# Patient Record
Sex: Female | Born: 1960 | Race: White | Hispanic: No | Marital: Married | Smoking: Current every day smoker
Health system: Southern US, Community
[De-identification: ages and names within clinical notes are randomized; demographics above are authoritative.]

## PROBLEM LIST (undated history)

## (undated) DIAGNOSIS — E0590 Thyrotoxicosis, unspecified without thyrotoxic crisis or storm: Secondary | ICD-10-CM

## (undated) DIAGNOSIS — E119 Type 2 diabetes mellitus without complications: Secondary | ICD-10-CM

## (undated) HISTORY — DX: Type 2 diabetes mellitus without complications: E11.9

## (undated) HISTORY — DX: Thyrotoxicosis, unspecified without thyrotoxic crisis or storm: E05.90

---

## 2012-03-26 ENCOUNTER — Other Ambulatory Visit (HOSPITAL_COMMUNITY): Payer: Self-pay | Admitting: Family Medicine

## 2012-03-26 DIAGNOSIS — Z139 Encounter for screening, unspecified: Secondary | ICD-10-CM

## 2012-03-27 ENCOUNTER — Ambulatory Visit (HOSPITAL_COMMUNITY)
Admission: RE | Admit: 2012-03-27 | Discharge: 2012-03-27 | Disposition: A | Discharge status: Home / Self Care | Payer: Self-pay | Source: Ambulatory Visit | Attending: Family Medicine | Admitting: Family Medicine

## 2012-03-27 DIAGNOSIS — Z139 Encounter for screening, unspecified: Secondary | ICD-10-CM

## 2012-03-27 DIAGNOSIS — Z1231 Encounter for screening mammogram for malignant neoplasm of breast: Secondary | ICD-10-CM | POA: Insufficient documentation

## 2012-04-02 ENCOUNTER — Other Ambulatory Visit: Payer: Self-pay | Admitting: Family Medicine

## 2012-04-02 DIAGNOSIS — R928 Other abnormal and inconclusive findings on diagnostic imaging of breast: Secondary | ICD-10-CM

## 2012-04-18 ENCOUNTER — Other Ambulatory Visit (HOSPITAL_COMMUNITY): Payer: Self-pay | Admitting: Family Medicine

## 2012-04-18 ENCOUNTER — Ambulatory Visit (HOSPITAL_COMMUNITY)
Admission: RE | Admit: 2012-04-18 | Discharge: 2012-04-18 | Disposition: A | Discharge status: Home / Self Care | Payer: Self-pay | Source: Ambulatory Visit | Attending: Family Medicine | Admitting: Family Medicine

## 2012-04-18 DIAGNOSIS — R928 Other abnormal and inconclusive findings on diagnostic imaging of breast: Secondary | ICD-10-CM | POA: Insufficient documentation

## 2014-09-23 ENCOUNTER — Emergency Department (HOSPITAL_COMMUNITY)
Admission: EM | Admit: 2014-09-23 | Discharge: 2014-09-24 | Disposition: A | Discharge status: Home / Self Care | Payer: Self-pay | Attending: Emergency Medicine | Admitting: Emergency Medicine

## 2014-09-23 ENCOUNTER — Encounter (HOSPITAL_COMMUNITY): Payer: Self-pay | Admitting: *Deleted

## 2014-09-23 ENCOUNTER — Emergency Department (HOSPITAL_COMMUNITY): Payer: Self-pay

## 2014-09-23 DIAGNOSIS — Z720 Tobacco use: Secondary | ICD-10-CM | POA: Insufficient documentation

## 2014-09-23 DIAGNOSIS — R079 Chest pain, unspecified: Secondary | ICD-10-CM | POA: Insufficient documentation

## 2014-09-23 LAB — CBC WITH DIFFERENTIAL/PLATELET
Basophils Absolute: 0 10*3/uL (ref 0.0–0.1)
Basophils Relative: 0 % (ref 0–1)
EOS ABS: 0.2 10*3/uL (ref 0.0–0.7)
Eosinophils Relative: 1 % (ref 0–5)
HCT: 41.3 % (ref 36.0–46.0)
Hemoglobin: 13.8 g/dL (ref 12.0–15.0)
LYMPHS ABS: 2.7 10*3/uL (ref 0.7–4.0)
LYMPHS PCT: 18 % (ref 12–46)
MCH: 29.8 pg (ref 26.0–34.0)
MCHC: 33.4 g/dL (ref 30.0–36.0)
MCV: 89.2 fL (ref 78.0–100.0)
MONO ABS: 1.1 10*3/uL — AB (ref 0.1–1.0)
Monocytes Relative: 7 % (ref 3–12)
Neutro Abs: 11 10*3/uL — ABNORMAL HIGH (ref 1.7–7.7)
Neutrophils Relative %: 74 % (ref 43–77)
PLATELETS: 255 10*3/uL (ref 150–400)
RBC: 4.63 MIL/uL (ref 3.87–5.11)
RDW: 13.1 % (ref 11.5–15.5)
WBC: 14.9 10*3/uL — AB (ref 4.0–10.5)

## 2014-09-23 LAB — I-STAT TROPONIN, ED: Troponin i, poc: 0.01 ng/mL (ref 0.00–0.08)

## 2014-09-23 LAB — TROPONIN I: Troponin I: <0.3 ng/mL (ref <0.30)

## 2014-09-23 LAB — BASIC METABOLIC PANEL
Anion gap: 16 — ABNORMAL HIGH (ref 5–15)
BUN: 10 mg/dL (ref 6–23)
CALCIUM: 9.5 mg/dL (ref 8.4–10.5)
CO2: 22 meq/L (ref 19–32)
Chloride: 101 mEq/L (ref 96–112)
Creatinine, Ser: 0.79 mg/dL (ref 0.50–1.10)
GFR calc Af Amer: >90 mL/min (ref >90)
GLUCOSE: 168 mg/dL — AB (ref 70–99)
Potassium: 3.7 mEq/L (ref 3.7–5.3)
Sodium: 139 mEq/L (ref 137–147)

## 2014-09-23 MED ORDER — ASPIRIN 81 MG PO CHEW
324.0000 mg | CHEWABLE_TABLET | Freq: Once | ORAL | Status: AC
  Administered 2014-09-23: 324 mg via ORAL
  Filled 2014-09-23: qty 4

## 2014-09-23 NOTE — ED Notes (Signed)
MD at bedside. 

## 2014-09-23 NOTE — ED Notes (Signed)
Pt elected to wait for a repeat troponin. EDP made aware.

## 2014-09-23 NOTE — Discharge Instructions (Signed)

## 2014-09-23 NOTE — ED Provider Notes (Signed)
CSN: [id number]     Arrival date & time 09/23/14  1949 History   First MD Initiated Contact with Patient 09/23/14 2032     Chief Complaint  Patient presents with  . Chest Pain     Patient is a 53 y.o. female presenting with chest pain. The history is provided by the patient.  Chest Pain Pain location:  L chest Pain quality: dull   Pain radiates to:  Does not radiate Pain severity:  Moderate Onset quality:  Gradual Duration:  3 days Timing:  Constant Progression:  Improving Chronicity:  New Relieved by:  None tried Worsened by:  Nothing tried Associated symptoms: no abdominal pain, no diaphoresis, no dizziness, no shortness of breath, no syncope, not vomiting and no weakness   Risk factors: smoking   Risk factors: no coronary artery disease, no diabetes mellitus, no high cholesterol, no hypertension and no prior DVT/PE    Patient reports 3 days ago she had onset of left sided sharp pain that after 2 minutes became dull.  It has been present since but is now improving.  It does not radiate.  No sob/weakness/diaphoresis/nausea/vomiting.  No exertional CP.   She has no other complaints at this time She has never had this before   PMH - None Soc hx - smoker Family history - positive for CAD  History  Substance Use Topics  . Smoking status: Current Every Day Smoker  . Smokeless tobacco: Not on file  . Alcohol Use: Yes   OB History    No data available     Review of Systems  Constitutional: Negative for diaphoresis.  Respiratory: Negative for shortness of breath.   Cardiovascular: Positive for chest pain. Negative for syncope.  Gastrointestinal: Negative for vomiting and abdominal pain.  Neurological: Negative for dizziness, syncope and weakness.  All other systems reviewed and are negative.     Allergies  Review of patient's allergies indicates no known allergies.  Home Medications   Prior to Admission medications   Not on File   BP 150/93 mmHg  Pulse 90   Temp(Src) 99 F (37.2 C) (Oral)  Resp 18  Ht 5\' 3"  (1.6 m)  Wt 129 lb (58.514 kg)  BMI 22.86 kg/m2  SpO2 99%  LMP  (LMP Unknown) Physical Exam CONSTITUTIONAL: Well developed/well nourished HEAD: Normocephalic/atraumatic EYES: EOMI/PERRL ENMT: Mucous membranes moist NECK: supple no meningeal signs SPINE/BACK:entire spine nontender CV: S1/S2 noted, no murmurs/rubs/gallops noted Chest - nontender to palpation LUNGS: Lungs are clear to auscultation bilaterally, no apparent distress ABDOMEN: soft, nontender, no rebound or guarding, bowel sounds noted throughout abdomen GU:no cva tenderness NEURO: Pt is awake/alert/appropriate, moves all extremitiesx4.  No facial droop.   EXTREMITIES: pulses normal/equal, full ROM SKIN: warm, color normal PSYCH: no abnormalities of mood noted, alert and oriented to situation  ED Course  Procedures   9:51 PM Pt well appearing Story is very atypical for ACS Initial HEART score at 33 (age, family history, smoking) 10:37 PM Plan is to check repeat troponin at 12:21am Pt is feeling improved Signed out to Dr Juleen ChinaKohut to f/u on troponin   Medications  aspirin chewable tablet 324 mg (324 mg Oral Given 09/23/14 2131)    Labs Review Labs Reviewed  BASIC METABOLIC PANEL - Abnormal; Notable for the following:    Glucose, Bld 168 (*)    Anion gap 16 (*)    All other components within normal limits  CBC WITH DIFFERENTIAL - Abnormal; Notable for the following:    WBC  14.9 (*)    Neutro Abs 11.0 (*)    Monocytes Absolute 1.1 (*)    All other components within normal limits  I-STAT TROPOININ, ED    Imaging Review Dg Chest Portable 1 View  09/23/2014   CLINICAL DATA:  Chest pain  EXAM: PORTABLE CHEST - 1 VIEW  COMPARISON:  None.  FINDINGS: No cardiomegaly. Negative aortic and hilar contour. Generous lung volumes with diffuse interstitial coarsening. No cephalized blood flow or Kerley lines. No effusion or pneumothorax. No asymmetric lung opacity.   IMPRESSION: Bronchitic changes.  No pneumonia or edema.   Electronically Signed   By: Tiburcio PeaJonathan  Watts M.D.   On: 09/23/2014 22:06     EKG Interpretation   Date/Time:  Tuesday September 23 2014 20:46:14 EST Ventricular Rate:  88 PR Interval:  140 QRS Duration: 77 QT Interval:  371 QTC Calculation: 449 R Axis:   42 Text Interpretation:  Sinus rhythm Low voltage, precordial leads Baseline  wander in lead(s) V5 V6 No previous ECGs available Confirmed by Bebe ShaggyWICKLINE   MD, Dorinda HillNALD ([id number]) on 09/23/2014 9:06:40 PM     Medications  aspirin chewable tablet 324 mg (324 mg Oral Given 09/23/14 2131)    MDM   Final diagnoses:  Chest pain    Nursing notes including past medical history and social history reviewed and considered in documentation xrays/imaging reviewed by myself and considered during evaluation Labs/vital reviewed myself and considered during evaluation     Joya Gaskinsonald W Naoko Diperna, MD 09/23/14 2238

## 2014-09-23 NOTE — ED Notes (Signed)
Dull lt chest pain, "sore",   No injury

## 2014-09-24 NOTE — ED Notes (Signed)
MD at bedside. 

## 2014-09-24 NOTE — ED Provider Notes (Signed)
Patient left the change of shift to get a second troponin. Patient reports she has had left upper lateral chest pain that started 3 days ago and was constant with a intermittent sharp pain that lasts 1-2 minutes. She denies shortness of breath, cough, or fever.  Patient is alert and cooperative in no distress.  Results for orders placed or performed during the hospital encounter of 09/23/14  Result Value Ref Range   Troponin I <0.30 <0.30 ng/mL  I-stat troponin, ED  Result Value Ref Range   Troponin i, poc 0.01 0.00 - 0.08 ng/mL   Comment 3           Dg Chest Portable 1 View  09/23/2014   CLINICAL DATA:  Chest pain  EXAM: PORTABLE CHEST - 1 VIEW  COMPARISON:  None.  FINDINGS: No cardiomegaly. Negative aortic and hilar contour. Generous lung volumes with diffuse interstitial coarsening. No cephalized blood flow or Kerley lines. No effusion or pneumothorax. No asymmetric lung opacity.  IMPRESSION: Bronchitic changes.  No pneumonia or edema.   Electronically Signed   By: Tiburcio PeaJonathan  Watts M.D.   On: 09/23/2014 22:06     EKG Interpretation  Date/Time:  Tuesday September 23 2014 20:46:14 EST Ventricular Rate:  88 PR Interval:  140 QRS Duration: 77 QT Interval:  371 QTC Calculation: 449 R Axis:   42 Text Interpretation:  Sinus rhythm Low voltage, precordial leads Baseline wander in lead(s) V5 V6 No previous ECGs available Confirmed by Bebe ShaggyWICKLINE  MD, Dorinda HillNALD ([id number]) on 09/23/2014 9:06:40 PM      Plan discharge with cardiology referral.    Devoria AlbeIva Zadin Lange, MD, Franz DellFACEP   Jesper Stirewalt L Pansie Guggisberg, MD 09/24/14 [phone]

## 2015-12-06 IMAGING — CR DG CHEST 1V PORT
1 series · 1 of 1 positions shown · non-contrast
Comparison: None.

CLINICAL DATA: Chest pain

EXAM:
PORTABLE CHEST - 1 VIEW

[portable]
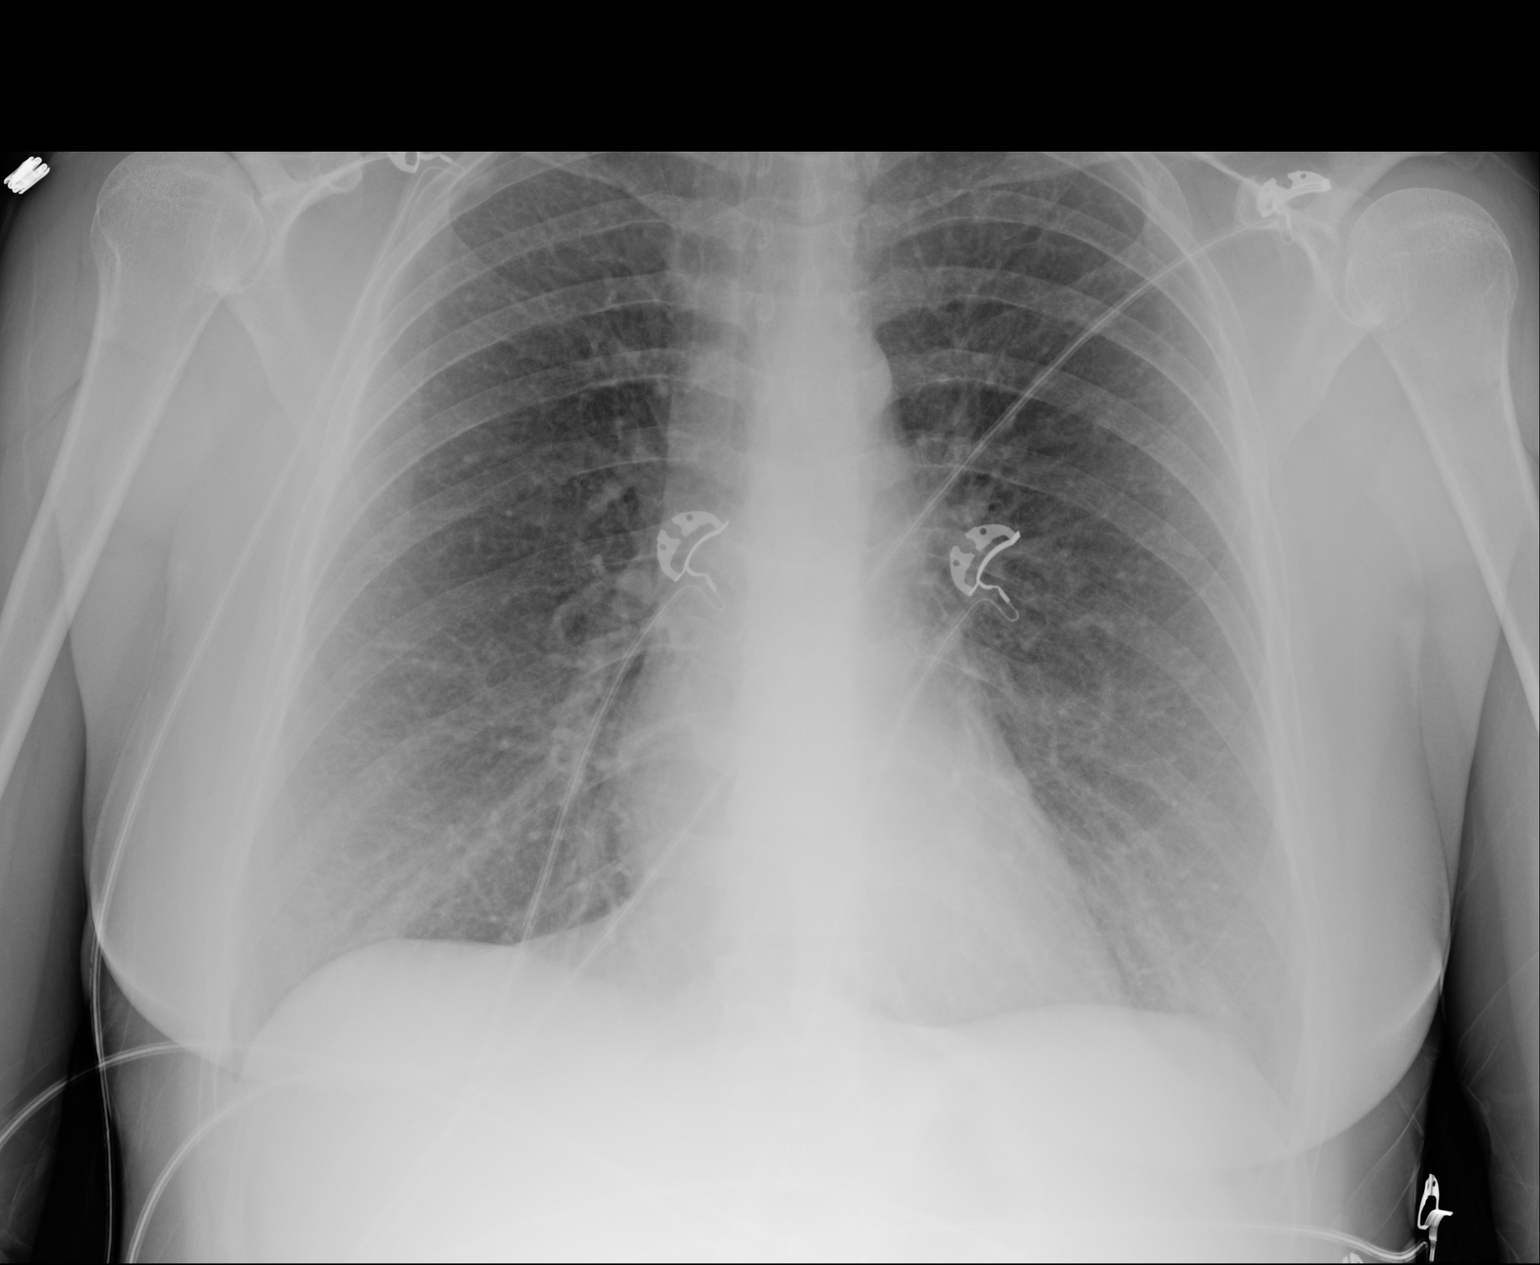

[1 of 1 positions shown; findings below may reference images not displayed]

FINDINGS: No cardiomegaly. Negative aortic and hilar contour. Generous lung
volumes with diffuse interstitial coarsening. No cephalized blood
flow or Kerley lines. No effusion or pneumothorax. No asymmetric
lung opacity.
IMPRESSION: Bronchitic changes.  No pneumonia or edema.

## 2017-09-26 ENCOUNTER — Ambulatory Visit (HOSPITAL_COMMUNITY)
Admission: RE | Admit: 2017-09-26 | Discharge: 2017-09-26 | Disposition: A | Discharge status: Home / Self Care | Payer: Medicaid Other | Source: Ambulatory Visit | Attending: Family Medicine | Admitting: Family Medicine

## 2017-09-26 ENCOUNTER — Other Ambulatory Visit (HOSPITAL_COMMUNITY): Payer: Self-pay | Admitting: Family Medicine

## 2017-09-26 DIAGNOSIS — M25522 Pain in left elbow: Secondary | ICD-10-CM | POA: Diagnosis present

## 2018-06-21 ENCOUNTER — Other Ambulatory Visit (HOSPITAL_COMMUNITY): Payer: Self-pay | Admitting: Family Medicine

## 2018-06-21 DIAGNOSIS — Z1231 Encounter for screening mammogram for malignant neoplasm of breast: Secondary | ICD-10-CM

## 2018-06-25 ENCOUNTER — Other Ambulatory Visit (HOSPITAL_COMMUNITY): Payer: Self-pay | Admitting: Family Medicine

## 2018-06-25 DIAGNOSIS — R921 Mammographic calcification found on diagnostic imaging of breast: Secondary | ICD-10-CM

## 2021-05-26 LAB — TSH: TSH: 0.01 — AB (ref 0.41–5.90)

## 2021-05-28 ENCOUNTER — Other Ambulatory Visit (HOSPITAL_COMMUNITY): Payer: Self-pay | Admitting: Family Medicine

## 2021-05-28 DIAGNOSIS — E0590 Thyrotoxicosis, unspecified without thyrotoxic crisis or storm: Secondary | ICD-10-CM

## 2021-06-08 ENCOUNTER — Ambulatory Visit (HOSPITAL_COMMUNITY)
Admission: RE | Admit: 2021-06-08 | Discharge: 2021-06-08 | Disposition: A | Discharge status: Home / Self Care | Payer: Medicaid Other | Source: Ambulatory Visit | Attending: Family Medicine | Admitting: Family Medicine

## 2021-06-08 ENCOUNTER — Other Ambulatory Visit: Payer: Self-pay

## 2021-06-08 DIAGNOSIS — E0590 Thyrotoxicosis, unspecified without thyrotoxic crisis or storm: Secondary | ICD-10-CM | POA: Diagnosis not present

## 2021-06-23 ENCOUNTER — Other Ambulatory Visit: Payer: Self-pay | Admitting: Otolaryngology

## 2021-06-23 ENCOUNTER — Other Ambulatory Visit (HOSPITAL_COMMUNITY): Payer: Self-pay | Admitting: Otolaryngology

## 2021-06-23 DIAGNOSIS — E041 Nontoxic single thyroid nodule: Secondary | ICD-10-CM

## 2021-06-24 ENCOUNTER — Ambulatory Visit: Payer: Medicaid Other | Admitting: Nurse Practitioner

## 2021-06-24 ENCOUNTER — Other Ambulatory Visit: Payer: Self-pay

## 2021-06-24 ENCOUNTER — Encounter: Payer: Self-pay | Admitting: Nurse Practitioner

## 2021-06-24 VITALS — BP 105/63 | HR 85 | Ht 62.25 in | Wt 119.6 lb

## 2021-06-24 DIAGNOSIS — E0590 Thyrotoxicosis, unspecified without thyrotoxic crisis or storm: Secondary | ICD-10-CM | POA: Diagnosis not present

## 2021-06-24 NOTE — Progress Notes (Signed)
06/24/2021     Endocrinology Consult Note    Subjective:    Patient ID: Monica Cisneros, female    DOB: 24-Jul-1961, PCP Zhou-Talbert, Ralene Bathe, MD.   Past Medical History:  Diagnosis Date   Diabetes mellitus, type II (HCC)    Hyperthyroidism     History reviewed. No pertinent surgical history.  Social History   Socioeconomic History   Marital status: Married    Spouse name: Not on file   Number of children: Not on file   Years of education: Not on file   Highest education level: Not on file  Occupational History   Not on file  Tobacco Use   Smoking status: Every Day    Packs/day: 1.00    Types: Cigarettes   Smokeless tobacco: Never  Vaping Use   Vaping Use: Never used  Substance and Sexual Activity   Alcohol use: Not Currently   Drug use: No   Sexual activity: Not on file  Other Topics Concern   Not on file  Social History Narrative   Not on file   Social Determinants of Health   Financial Resource Strain: Not on file  Food Insecurity: Not on file  Transportation Needs: Not on file  Physical Activity: Not on file  Stress: Not on file  Social Connections: Not on file    History reviewed. No pertinent family history.  Outpatient Encounter Medications as of 06/24/2021  Medication Sig   Ascorbic Acid (VITAMIN C) 1000 MG tablet 1 tablet   aspirin 81 MG chewable tablet Chew 81 mg by mouth daily.   atorvastatin (LIPITOR) 40 MG tablet Take 40 mg by mouth daily.   cholecalciferol (VITAMIN D3) 25 MCG (1000 UNIT) tablet 1 tablet   cyclobenzaprine (FLEXERIL) 10 MG tablet Take 10 mg by mouth 3 (three) times daily.   escitalopram (LEXAPRO) 10 MG tablet Take 10 mg by mouth daily.   hydrOXYzine (ATARAX/VISTARIL) 25 MG tablet 1 tablet as needed   insulin detemir (LEVEMIR FLEXTOUCH) 100 UNIT/ML FlexPen Inject 15 Units into the skin at bedtime.   lisinopril (ZESTRIL) 10 MG tablet Take 10 mg by mouth daily.   metFORMIN (GLUCOPHAGE) 500 MG tablet Take 500 mg by  mouth 2 (two) times daily.   naproxen (NAPROSYN) 500 MG tablet Take 500 mg by mouth 2 (two) times daily. For neck   Omega-3 Fatty Acids (FISH OIL) 1000 MG CAPS 1 capsule   No facility-administered encounter medications on file as of 06/24/2021.    ALLERGIES: No Known Allergies  VACCINATION STATUS:  There is no immunization history on file for this patient.   HPI  Monica Cisneros is 60 y.o. female who presents today with a medical history as above. she is being seen in consultation for hyperthyroidism requested by Zhou-Talbert, Ralene Bathe, MD.  she has been dealing with symptoms of irritability, unintentional weight loss, tremors, and insomnia for about 6 months. These symptoms are progressively worsening and troubling to her.  her most recent thyroid labs revealed suppressed TSH of < 0.01 and elevated Free T4 of 2.3 on 05/26/21.  she denies dysphagia, choking, shortness of breath, no recent voice change.    she does have extensive family history of thyroid dysfunction in her daughters, siblings, and parents, but denies family hx of thyroid cancer. she denies personal history of goiter. she is not on any anti-thyroid medications nor on any thyroid hormone supplements. Denies use of Biotin containing supplements.  she is willing to proceed with appropriate work  up and therapy for thyrotoxicosis.  She did have thyroid ultrasound on 06/08/21 showing moderately suspicious nodule in the left upper lobe which recommended biopsy which she has scheduled for next Thursday.  Review of systems  Constitutional: + steadily decreasing body weight, current Body mass index is 21.7 kg/m., no fatigue, no subjective hyperthermia, no subjective hypothermia Eyes: no blurry vision, no xerophthalmia ENT: no sore throat, no nodules palpated in throat, no dysphagia/odynophagia, no hoarseness Cardiovascular: no chest pain, no shortness of breath, no palpitations, no leg swelling Respiratory: no cough, no shortness of  breath Gastrointestinal: no nausea/vomiting/diarrhea Musculoskeletal: no muscle/joint aches Skin: no rashes, no hyperemia Neurological: + tremors, no numbness, no tingling, no dizziness Psychiatric: no depression, + anxiety, irritability, insomnia   Objective:    BP 105/63   Pulse 85   Ht 5' 2.25" (1.581 m)   Wt 119 lb 9.6 oz (54.3 kg)   LMP  (LMP Unknown)   BMI 21.70 kg/m   Wt Readings from Last 3 Encounters:  06/24/21 119 lb 9.6 oz (54.3 kg)  09/23/14 129 lb (58.5 kg)     BP Readings from Last 3 Encounters:  06/24/21 105/63  09/23/14 150/93                         Physical Exam- Limited  Constitutional:  Body mass index is 21.7 kg/m. , not in acute distress, normal state of mind Eyes:  EOMI, no exophthalmos Neck: Supple Thyroid: No gross goiter Cardiovascular: RRR, no murmurs, rubs, or gallops, no edema Respiratory: Adequate breathing efforts, no crackles, rales, rhonchi, or wheezing Musculoskeletal: no gross deformities, strength intact in all four extremities, no gross restriction of joint movements Skin:  no rashes, no hyperemia Neurological: + tremor with outstretched hands   CMP     Component Value Date/Time   NA 139 09/23/2014 2114   K 3.7 09/23/2014 2114   CL 101 09/23/2014 2114   CO2 22 09/23/2014 2114   GLUCOSE 168 (H) 09/23/2014 2114   BUN 10 09/23/2014 2114   CREATININE 0.79 09/23/2014 2114   CALCIUM 9.5 09/23/2014 2114   GFRNONAA >90 09/23/2014 2114   GFRAA >90 09/23/2014 2114     CBC    Component Value Date/Time   WBC 14.9 (H) 09/23/2014 2114   RBC 4.63 09/23/2014 2114   HGB 13.8 09/23/2014 2114   HCT 41.3 09/23/2014 2114   PLT 255 09/23/2014 2114   MCV 89.2 09/23/2014 2114   MCH 29.8 09/23/2014 2114   MCHC 33.4 09/23/2014 2114   RDW 13.1 09/23/2014 2114   LYMPHSABS 2.7 09/23/2014 2114   MONOABS 1.1 (H) 09/23/2014 2114   EOSABS 0.2 09/23/2014 2114   BASOSABS 0.0 09/23/2014 2114     Diabetic Labs (most recent): No results  found for: HGBA1C  Lipid Panel  No results found for: CHOL, TRIG, HDL, CHOLHDL, VLDL, LDLCALC, LDLDIRECT, LABVLDL   Lab Results  Component Value Date   TSH 0.01 (A) 05/26/2021     Thyroid US from 06/08/2021 CLINICAL DATA:  Hyperthyroid.   EXAM: THYROID ULTRASOUND   TECHNIQUE: Ultrasound examination of the thyroid gland and adjacent soft tissues was performed.   COMPARISON:  None.   FINDINGS: Parenchymal Echotexture: Moderately heterogenous   Isthmus: 0.2 cm   Right lobe: 4.6 x 2.0 x 1.4 cm   Left lobe: 4.2 x 1.7 x 1.4 cm   _________________________________________________________   Estimated total number of nodules >/= 1 cm: 1   Number of spongiform  nodules >/=  2 cm not described below (TR1): 0   Number of mixed cystic and solid nodules >/= 1.5 cm not described below (TR2): 0   _________________________________________________________   Nodule # 2:   Location: Left; Superior   Maximum size: 1.7 cm; Other 2 dimensions: 1.4 x 1.1 cm   Composition: solid/almost completely solid (2)   Echogenicity: hypoechoic (2)   Shape: not taller-than-wide (0)   Margins: smooth (0)   Echogenic foci: none (0)   ACR TI-RADS total points: 4.   ACR TI-RADS risk category: TR4 (4-6 points).   ACR TI-RADS recommendations:   **Given size (>/= 1.5 cm) and appearance, fine needle aspiration of this moderately suspicious nodule should be considered based on TI-RADS criteria.   _________________________________________________________   Additional small subcentimeter thyroid nodules bilaterally which do not meet criteria to warrant further evaluation.   IMPRESSION: Approximately 1.7 cm TI-RADS category 4 nodule in the left superior gland meets criteria to consider fine-needle aspiration biopsy.   The background thyroid gland is moderately heterogeneous in appearance.   The above is in keeping with the ACR TI-RADS recommendations - J Am Coll Radiol  2017;14:587-595.     Electronically Signed   By: Malachy Moan M.D.   On: 06/08/2021 11:46   Assessment & Plan:   1. Hyperthyroidism- supect r/t Graves disease given strong family history and presentation  she is being seen at a kind request of Zhou-Talbert, Ralene Bathe, MD.  her history and most recent labs are reviewed, and she was examined clinically. Subjective and objective findings are consistent with thyrotoxicosis likely from primary hyperthyroidism. The potential risks of untreated thyrotoxicosis and the need for definitive therapy have been discussed in detail with her, and she agrees to proceed with diagnostic workup and treatment plan.   Confirmatory thyroid uptake and scan will be scheduled to be done as soon as possible.  I recommended holding off on the nodule biopsy for now as the hyperthyroidism is more important at this time and because the likelihood of malignancy and hyperthyroidism is extremely rare.   Options of therapy are discussed with her.  We discussed the option of treating it with medications including methimazole or PTU which may have side effects including rash, transaminitis, and bone marrow suppression.  We also discussed the option of definitive therapy with RAI ablation of the thyroid. If she is found to have primary hyperthyroidism from Graves' disease , toxic multinodular goiter or toxic nodular goiter the preferred modality of treatment would be I-131 thyroid ablation. Surgery is another choice of treatment in some cases, in her case surgery is not a good fit for presentation without goiter.  -Patient is made aware of the high likelihood of post ablative hypothyroidism with subsequent need for lifelong thyroid hormone replacement. sheunderstands this outcome and she is  willing to proceed.      she will return in 3 weeks to discuss test results and to discuss treatment options further.   I did not initiate any new prescriptions today.  Her HR was  controlled.     -Patient is advised to maintain close follow up with Zhou-Talbert, Ralene Bathe, MD for primary care needs.   - Time spent with the patient: 60 minutes, of which >50% was spent in obtaining information about her symptoms, reviewing her previous labs, evaluations, and treatments, counseling her about her hyperthyroidism , and developing a plan to confirm the diagnosis and long term treatment as necessary. Please refer to "Patient Self Inventory" in the  Media tab for reviewed elements of pertinent patient history.  Jannae Traxler participated in the discussions, expressed understanding, and voiced agreement with the above plans.  All questions were answered to her satisfaction. she is encouraged to contact clinic should she have any questions or concerns prior to her return visit.   Follow up plan: Return in about 3 weeks (around 07/15/2021) for Thyroid follow up; uptake and scan.   Thank you for involving me in the care of this pleasant patient, and I will continue to update you with her progress.    Ronny Bacon, [facility] [address] [place], [place] [address] Phone: [phone] Fax: [phone]  06/24/2021, 4:10 PM

## 2021-06-24 NOTE — Patient Instructions (Signed)
Hyperthyroidism  Hyperthyroidism is when the thyroid gland is too active (overactive). The thyroid gland is a small gland located in the lower front part of the neck, just in front of the windpipe (trachea). This gland makes hormones that help control how the body uses food for energy (metabolism) as well as how the heart and brain function. These hormones also play a role in keeping your bones strong. When the thyroid is overactive, it produces toomuch of a hormone called thyroxine. What are the causes? This condition may be caused by: Graves' disease. This is a disorder in which the body's disease-fighting system (immune system) attacks the thyroid gland. This is the most common cause. Inflammation of the thyroid gland. A tumor in the thyroid gland. Use of certain medicines, including: Prescription thyroid hormone replacement. Herbal supplements that mimic thyroid hormones. Amiodarone therapy. Solid or fluid-filled lumps within your thyroid gland (thyroid nodules). Taking in a large amount of iodine from foods or medicines. What increases the risk? You are more likely to develop this condition if: You are female. You have a family history of thyroid conditions. You smoke tobacco. You use a medicine called lithium. You take medicines that affect the immune system (immunosuppressants). What are the signs or symptoms? Symptoms of this condition include: Nervousness. Inability to tolerate heat. Unexplained weight loss. Diarrhea. Change in the texture of hair or skin. Heart skipping beats or making extra beats. Rapid heart rate. Loss of menstruation. Shaky hands. Fatigue. Restlessness. Sleep problems. Enlarged thyroid gland or a lump in the thyroid (nodule). You may also have symptoms of Graves' disease, which may include: Protruding eyes. Dry eyes. Red or swollen eyes. Problems with vision. How is this diagnosed? This condition may be diagnosed based on: Your symptoms and  medical history. A physical exam. Blood tests. Thyroid ultrasound. This test involves using sound waves to produce images of the thyroid gland. A thyroid scan. A radioactive substance is injected into a vein, and images show how much iodine is present in the thyroid. Radioactive iodine uptake test (RAIU). A small amount of radioactive iodine is given by mouth to see how much iodine the thyroid absorbs after a certain amount of time. How is this treated? Treatment depends on the cause and severity of the condition. Treatment may include: Medicines to reduce the amount of thyroid hormone your body makes. Radioactive iodine treatment (radioiodine therapy). This involves swallowing a small dose of radioactive iodine, in capsule or liquid form, to kill thyroid cells. Surgery to remove part or all of your thyroid gland. You may need to take thyroid hormone replacement medicine for the rest of your life after thyroid surgery. Medicines to help manage your symptoms. Follow these instructions at home:  Take over-the-counter and prescription medicines only as told by your health care provider. Do not use any products that contain nicotine or tobacco, such as cigarettes and e-cigarettes. If you need help quitting, ask your health care provider. Follow any instructions from your health care provider about diet. You may be instructed to limit foods that contain iodine. Keep all follow-up visits as told by your health care provider. This is important. You will need to have blood tests regularly so that your health care provider can monitor your condition. Contact a health care provider if: Your symptoms do not get better with treatment. You have a fever. You are taking thyroid hormone replacement medicine and you: Have symptoms of depression. Feel like you are tired all the time. Gain weight. Get help right   away if: You have chest pain. You have decreased alertness or a change in your awareness. You  have abdominal pain. You feel dizzy. You have a rapid heartbeat. You have an irregular heartbeat. You have difficulty breathing. Summary The thyroid gland is a small gland located in the lower front part of the neck, just in front of the windpipe (trachea). Hyperthyroidism is when the thyroid gland is too active (overactive) and produces too much of a hormone called thyroxine. The most common cause is Graves' disease, a disorder in which your immune system attacks the thyroid gland. Hyperthyroidism can cause various symptoms, such as unexplained weight loss, nervousness, inability to tolerate heat, or changes in your heartbeat. Treatment may include medicine to reduce the amount of thyroid hormone your body makes, radioiodine therapy, surgery, or medicines to manage symptoms. This information is not intended to replace advice given to you by your health care provider. Make sure you discuss any questions you have with your healthcare provider. Document Revised: 06/11/2020 Document Reviewed: 06/11/2020 Elsevier Patient Education  2022 [employer]  

## 2021-07-01 ENCOUNTER — Ambulatory Visit (HOSPITAL_COMMUNITY): Payer: Medicaid Other

## 2021-07-01 ENCOUNTER — Encounter (HOSPITAL_COMMUNITY): Payer: Self-pay

## 2021-07-08 ENCOUNTER — Telehealth: Payer: Self-pay

## 2021-07-08 NOTE — Telephone Encounter (Signed)
Pt left a VM to return a call

## 2021-07-08 NOTE — Telephone Encounter (Signed)
Left a message requesting a return call to the office. 

## 2021-07-08 NOTE — Telephone Encounter (Signed)
Patient has not heard from anyone to sch her uptake and scan and she is to see [place] next Thursday with the results. Please Advise

## 2021-07-08 NOTE — Telephone Encounter (Signed)
Pt scheduled for thyroid uptake & scan on Oct 4th at 10:00 and 1:30 and the 5th at 10:00. Pt made aware.

## 2021-07-13 ENCOUNTER — Encounter (HOSPITAL_COMMUNITY)
Admission: RE | Admit: 2021-07-13 | Discharge: 2021-07-13 | Disposition: A | Discharge status: Home / Self Care | Payer: Medicaid Other | Source: Ambulatory Visit | Attending: Nurse Practitioner | Admitting: Nurse Practitioner

## 2021-07-13 ENCOUNTER — Other Ambulatory Visit: Payer: Self-pay

## 2021-07-13 DIAGNOSIS — R634 Abnormal weight loss: Secondary | ICD-10-CM | POA: Insufficient documentation

## 2021-07-13 DIAGNOSIS — Z8349 Family history of other endocrine, nutritional and metabolic diseases: Secondary | ICD-10-CM | POA: Insufficient documentation

## 2021-07-13 DIAGNOSIS — E0590 Thyrotoxicosis, unspecified without thyrotoxic crisis or storm: Secondary | ICD-10-CM | POA: Diagnosis not present

## 2021-07-13 MED ORDER — SODIUM IODIDE I-123 7.4 MBQ CAPS
434.0000 | ORAL_CAPSULE | Freq: Once | ORAL | Status: AC
  Administered 2021-07-13: 434 via ORAL

## 2021-07-14 ENCOUNTER — Encounter (HOSPITAL_COMMUNITY)
Admission: RE | Admit: 2021-07-14 | Discharge: 2021-07-14 | Disposition: A | Discharge status: Home / Self Care | Payer: Medicaid Other | Source: Ambulatory Visit | Attending: Nurse Practitioner | Admitting: Nurse Practitioner

## 2021-07-15 ENCOUNTER — Ambulatory Visit: Payer: Medicaid Other | Admitting: Nurse Practitioner

## 2021-07-19 ENCOUNTER — Ambulatory Visit: Payer: Medicaid Other | Admitting: Nurse Practitioner

## 2021-07-19 ENCOUNTER — Other Ambulatory Visit: Payer: Self-pay

## 2021-07-19 ENCOUNTER — Encounter: Payer: Self-pay | Admitting: Nurse Practitioner

## 2021-07-19 VITALS — BP 163/77 | HR 86 | Ht 62.25 in | Wt 125.4 lb

## 2021-07-19 DIAGNOSIS — E0500 Thyrotoxicosis with diffuse goiter without thyrotoxic crisis or storm: Secondary | ICD-10-CM | POA: Diagnosis not present

## 2021-07-19 DIAGNOSIS — E0590 Thyrotoxicosis, unspecified without thyrotoxic crisis or storm: Secondary | ICD-10-CM

## 2021-07-19 NOTE — Patient Instructions (Signed)
Radioiodine (I-131) Therapy for Hyperthyroidism, Care After This sheet gives you information about how to care for yourself after your procedure. Your health care provider may also give you more specific instructions. If you have problems or questions, contact your health careprovider. What can I expect after the procedure? After the procedure, it is common to have a sore throat or mild neck pain forseveral months. Follow these instructions at home: For the first 48 hours after the procedure:  Do not use public bathrooms. Flush twice after using the toilet. Use tissue to wipe up any urine on the toilet seat. Men should sit down to urinate to reduce the risk of splashing. Take a bath or shower every day. Rinse the sink and tub after each use. Do not make food for other people. Wash your sheets, towels, and clothes each day. Wash them separately from other people's items. Drink enough fluid to keep your urine pale yellow.  Pregnancy and breastfeeding Do not try to become pregnant for as long as you are told by your health care provider. This may be for up to 1 year after your procedure. Use a method of birth control (contraception) to prevent pregnancy. Talk to your health care provider about what form of contraception is right for you. Do not breastfeed for as long as you are told by your health care provider, if this applies. General instructions  Avoid close contact with other people. Avoiding contact with children and pregnant women is especially important. Do this for 1 week after your procedure. Try to keep a distance of about 6 feet (1.8 m) from others. Sleep alone. Do not have close intimate contact of any kind. This includes kissing, physical contact, and sexual intercourse. If you have children, arrange for child care. Do not ride in vehicles with other people. Do not stay in a hotel. Stay home from work or school as told by your health care provider. Take over-the-counter and  prescription medicines only as told by your health care provider. This includes any thyroid medicines. When your health care provider tells you it is safe to travel, carry a note from the health care provider to explain that you have had radioiodine therapy. This is needed because radioactivity may set off detectors in airports or other places. Do not share utensils--such as silverware, plates, or cups--for as long as told by your health care provider. Use disposable utensils, or clean your utensils separately from those of others. Wash your hands often. If soap and water are not available, use hand sanitizer. Keep all follow-up visits as told by your health care provider. This is important. Follow-up visits may be required every 1-2 months after treatment.  Contact a health care provider if you: Have pain that gets worse or does not get better with medicine. Have a dry mouth. Lose your sense of taste. Become pregnant within 1 year of your procedure, if this applies. Feel unusually tired (fatigued). Have very dry skin. Start to lose your hair. Have bowel movements that are less frequent or more difficult than usual (constipation). Have unexplained weight gain. Always feel cold. Summary After the procedure, it is common to have a sore throat or mild neck pain for several months. Do not try to become pregnant for as long as you are told by your health care provider. Avoid close contact with other people. Avoiding contact with children and pregnant women is especially important. Do this for 1 week after your procedure. Keep all follow-up visits as told by   your health care provider. This is important. This information is not intended to replace advice given to you by your health care provider. Make sure you discuss any questions you have with your healthcare provider. Document Revised: 11/08/2018 Document Reviewed: 11/08/2018 Elsevier Patient Education  2022 Elsevier Inc.  

## 2021-07-19 NOTE — Progress Notes (Signed)
07/19/2021     Endocrinology Follow Up Note    Subjective:    Patient ID: Monica Cisneros, female    DOB: November 14, 1960, PCP Zhou-Talbert, Ralene Bathe, MD.   Past Medical History:  Diagnosis Date   Diabetes mellitus, type II (HCC)    Hyperthyroidism     History reviewed. No pertinent surgical history.  Social History   Socioeconomic History   Marital status: Married    Spouse name: Not on file   Number of children: Not on file   Years of education: Not on file   Highest education level: Not on file  Occupational History   Not on file  Tobacco Use   Smoking status: Every Day    Packs/day: 1.00    Types: Cigarettes   Smokeless tobacco: Never  Vaping Use   Vaping Use: Never used  Substance and Sexual Activity   Alcohol use: Not Currently   Drug use: No   Sexual activity: Not on file  Other Topics Concern   Not on file  Social History Narrative   Not on file   Social Determinants of Health   Financial Resource Strain: Not on file  Food Insecurity: Not on file  Transportation Needs: Not on file  Physical Activity: Not on file  Stress: Not on file  Social Connections: Not on file    History reviewed. No pertinent family history.  Outpatient Encounter Medications as of 07/19/2021  Medication Sig   Ascorbic Acid (VITAMIN C) 1000 MG tablet 1 tablet   aspirin 81 MG chewable tablet Chew 81 mg by mouth daily.   atorvastatin (LIPITOR) 40 MG tablet Take 40 mg by mouth daily.   cholecalciferol (VITAMIN D3) 25 MCG (1000 UNIT) tablet 1 tablet   cyclobenzaprine (FLEXERIL) 10 MG tablet Take 10 mg by mouth 3 (three) times daily.   escitalopram (LEXAPRO) 10 MG tablet Take 10 mg by mouth daily.   hydrOXYzine (ATARAX/VISTARIL) 25 MG tablet 1 tablet as needed   insulin detemir (LEVEMIR FLEXTOUCH) 100 UNIT/ML FlexPen Inject 15 Units into the skin at bedtime.   lisinopril (ZESTRIL) 10 MG tablet Take 10 mg by mouth daily.   metFORMIN (GLUCOPHAGE) 500 MG tablet Take 500 mg  by mouth 2 (two) times daily.   naproxen (NAPROSYN) 500 MG tablet Take 500 mg by mouth 2 (two) times daily. For neck   Omega-3 Fatty Acids (FISH OIL) 1000 MG CAPS 1 capsule   No facility-administered encounter medications on file as of 07/19/2021.    ALLERGIES: No Known Allergies  VACCINATION STATUS:  There is no immunization history on file for this patient.   HPI  Monica Cisneros is 60 y.o. female who presents today with a medical history as above. she is being seen in follow up after being seen in consultation for hyperthyroidism requested by Zhou-Talbert, Ralene Bathe, MD.  she has been dealing with symptoms of irritability, unintentional weight loss, tremors, and insomnia for about 6 months. These symptoms are progressively worsening and troubling to her.  her most recent thyroid labs revealed suppressed TSH of < 0.01 and elevated Free T4 of 2.3 on 05/26/21.  she denies dysphagia, choking, shortness of breath, no recent voice change.    she does have extensive family history of thyroid dysfunction in her daughters, siblings, and parents, but denies family hx of thyroid cancer. she denies personal history of goiter. she is not on any anti-thyroid medications nor on any thyroid hormone supplements. Denies use of Biotin containing supplements.  she  is willing to proceed with appropriate work up and therapy for thyrotoxicosis.  She did have thyroid ultrasound on 06/08/21 showing moderately suspicious nodule in the left upper lobe which recommended biopsy which she has scheduled for next Thursday.  Review of systems  Constitutional: + steadily decreasing body weight, current Body mass index is 22.75 kg/m., no fatigue, no subjective hyperthermia, no subjective hypothermia Eyes: no blurry vision, no xerophthalmia ENT: no sore throat, no nodules palpated in throat, no dysphagia/odynophagia, no hoarseness Cardiovascular: no chest pain, no shortness of breath, no palpitations, no leg  swelling Respiratory: no cough, no shortness of breath Gastrointestinal: no nausea/vomiting/diarrhea Musculoskeletal: no muscle/joint aches Skin: no rashes, no hyperemia Neurological: + tremors, no numbness, no tingling, no dizziness Psychiatric: no depression, + anxiety, irritability, insomnia   Objective:    BP (!) 163/77   Pulse 86   Ht 5' 2.25" (1.581 m)   Wt 125 lb 6.4 oz (56.9 kg)   LMP  (LMP Unknown)   BMI 22.75 kg/m   Wt Readings from Last 3 Encounters:  07/19/21 125 lb 6.4 oz (56.9 kg)  06/24/21 119 lb 9.6 oz (54.3 kg)  09/23/14 129 lb (58.5 kg)     BP Readings from Last 3 Encounters:  07/19/21 (!) 163/77  06/24/21 105/63  09/23/14 150/93                         Physical Exam- Limited  Constitutional:  Body mass index is 22.75 kg/m. , not in acute distress, normal state of mind Eyes:  EOMI, no exophthalmos Neck: Supple Thyroid: No gross goiter Cardiovascular: RRR, no murmurs, rubs, or gallops, no edema Respiratory: Adequate breathing efforts, no crackles, rales, rhonchi, or wheezing Musculoskeletal: no gross deformities, strength intact in all four extremities, no gross restriction of joint movements Skin:  no rashes, no hyperemia Neurological: + tremor with outstretched hands   CMP     Component Value Date/Time   NA 139 09/23/2014 2114   K 3.7 09/23/2014 2114   CL 101 09/23/2014 2114   CO2 22 09/23/2014 2114   GLUCOSE 168 (H) 09/23/2014 2114   BUN 10 09/23/2014 2114   CREATININE 0.79 09/23/2014 2114   CALCIUM 9.5 09/23/2014 2114   GFRNONAA >90 09/23/2014 2114   GFRAA >90 09/23/2014 2114     CBC    Component Value Date/Time   WBC 14.9 (H) 09/23/2014 2114   RBC 4.63 09/23/2014 2114   HGB 13.8 09/23/2014 2114   HCT 41.3 09/23/2014 2114   PLT 255 09/23/2014 2114   MCV 89.2 09/23/2014 2114   MCH 29.8 09/23/2014 2114   MCHC 33.4 09/23/2014 2114   RDW 13.1 09/23/2014 2114   LYMPHSABS 2.7 09/23/2014 2114   MONOABS 1.1 (H) 09/23/2014 2114    EOSABS 0.2 09/23/2014 2114   BASOSABS 0.0 09/23/2014 2114     Diabetic Labs (most recent): No results found for: HGBA1C  Lipid Panel  No results found for: CHOL, TRIG, HDL, CHOLHDL, VLDL, LDLCALC, LDLDIRECT, LABVLDL   Lab Results  Component Value Date   TSH 0.01 (A) 05/26/2021     Thyroid US from 06/08/2021 CLINICAL DATA:  Hyperthyroid.   EXAM: THYROID ULTRASOUND   TECHNIQUE: Ultrasound examination of the thyroid gland and adjacent soft tissues was performed.   COMPARISON:  None.   FINDINGS: Parenchymal Echotexture: Moderately heterogenous   Isthmus: 0.2 cm   Right lobe: 4.6 x 2.0 x 1.4 cm   Left lobe: 4.2 x 1.7 x 1.4  cm   _________________________________________________________   Estimated total number of nodules >/= 1 cm: 1   Number of spongiform nodules >/=  2 cm not described below (TR1): 0   Number of mixed cystic and solid nodules >/= 1.5 cm not described below (TR2): 0   _________________________________________________________   Nodule # 2:   Location: Left; Superior   Maximum size: 1.7 cm; Other 2 dimensions: 1.4 x 1.1 cm   Composition: solid/almost completely solid (2)   Echogenicity: hypoechoic (2)   Shape: not taller-than-wide (0)   Margins: smooth (0)   Echogenic foci: none (0)   ACR TI-RADS total points: 4.   ACR TI-RADS risk category: TR4 (4-6 points).   ACR TI-RADS recommendations:   **Given size (>/= 1.5 cm) and appearance, fine needle aspiration of this moderately suspicious nodule should be considered based on TI-RADS criteria.   _________________________________________________________   Additional small subcentimeter thyroid nodules bilaterally which do not meet criteria to warrant further evaluation.   IMPRESSION: Approximately 1.7 cm TI-RADS category 4 nodule in the left superior gland meets criteria to consider fine-needle aspiration biopsy.   The background thyroid gland is moderately heterogeneous  in appearance.   The above is in keeping with the ACR TI-RADS recommendations - J Am Coll Radiol 2017;14:587-595.     Electronically Signed   By: Malachy Moan M.D.   On: 06/08/2021 11:46 -----------------------------------------------------------------------------------------------------------------------------  Uptake and Scan from 07/14/21 CLINICAL DATA:  Hyperthyroidism, suppressed TSH 0.01, family history of Graves disease, weight loss for 6 months   EXAM: THYROID SCAN AND UPTAKE - 4 AND 24 HOURS   TECHNIQUE: Following oral administration of I-123 capsule, anterior planar imaging was acquired at 24 hours. Thyroid uptake was calculated with a thyroid probe at 4-6 hours and 24 hours.   RADIOPHARMACEUTICALS:  434 uCi I-123 sodium iodide p.o.   COMPARISON:  None   Correlation: Thyroid ultrasound 06/08/2021   FINDINGS: Homogeneous tracer distribution throughout both thyroid lobes.   No focal areas of increased or decreased tracer localization seen.   4 hour I-123 uptake = 26.2% (normal 5-20%)   24 hour I-123 uptake = 51.0% (normal 10-30%)   IMPRESSION: Normal thyroid scan with elevated 4 hour and 24 hour radio iodine uptakes consistent with Graves disease.     Electronically Signed   By: Ulyses Southward M.D.   On: 07/14/2021 17:12  Assessment & Plan:   1. Hyperthyroidism- r/t Graves Disease  she is being seen at a kind request of Zhou-Talbert, Ralene Bathe, MD.  -Her uptake and scan shows 4-hr uptake of 26.2%, and 24-hr uptake of 51%, confirming suspicion of Graves disease.    -She will not need to proceed with biopsy of suspicious nodule as the likelihood of thyroid cancer AND Graves disease is very rare.  Options of therapy are discussed with her.  We discussed the option of treating it with medications including methimazole or PTU which may have side effects including rash, transaminitis, and bone marrow suppression.  We also discussed the option of definitive  therapy with RAI ablation of the thyroid. If she is found to have primary hyperthyroidism from Graves' disease, toxic multinodular goiter or toxic nodular goiter the preferred modality of treatment would be I-131 thyroid ablation. Surgery is another choice of treatment in some cases, in her case surgery is not a good fit for presentation without goiter.  She decided to proceed with RAI ablation.  Will need to recheck TFTs about 6 weeks after RAI ablation to assess response to  treatment and determine timing of thyroid hormone replacement therapy.     -Patient is advised to maintain close follow up with Zhou-Talbert, Ralene Bathe, MD for primary care needs.    I spent 20 minutes in the care of the patient today including review of labs from Thyroid Function, CMP, and other relevant labs ; imaging/biopsy records (current and previous including abstractions from other facilities); face-to-face time discussing  her lab results and symptoms, medications doses, her options of short and long term treatment based on the latest standards of care / guidelines;   and documenting the encounter.  Tiauna Harold  participated in the discussions, expressed understanding, and voiced agreement with the above plans.  All questions were answered to her satisfaction. she is encouraged to contact clinic should she have any questions or concerns prior to her return visit.  Follow up plan: Return in about 8 weeks (around 09/13/2021) for Thyroid follow up, Previsit labs; RAI ablation.   Thank you for involving me in the care of this pleasant patient, and I will continue to update you with her progress.    Ronny Bacon, [facility] [address] [place], [place] [address] Phone: [phone] Fax: [phone]  07/19/2021, 10:04 AM

## 2021-07-23 NOTE — Written Directive (Cosign Needed)
MOLECULAR IMAGING AND THERAPEUTICS WRITTEN DIRECTIVE   PATIENT NAME: Monica Cisneros  PT DOB:   1961/04/11                                              MRN: [id number]  ---------------------------------------------------------------------------------------------------------------------   I-131 WHOLE THYROID THERAPY (NON-CANCER)    RADIOPHARMACEUTICAL:   Iodine-131 Capsule    PRESCRIBED DOSE FOR ADMINISTRATION:    ROUTE OFADMINISTRATION: PO   DIAGNOSIS:     REFERRING PHYSICIAN:   TSH:    Lab Results  Component Value Date   TSH 0.01 (A) 05/26/2021     PRIOR I-131 THERAPY (Date and Dose):   PRIOR RADIOLOGY EXAMS (Results and Date): NM THYROID MULT UPTAKE W/IMAGING  Result Date: 07/14/2021 CLINICAL DATA:  Hyperthyroidism, suppressed TSH 0.01, family history of Graves disease, weight loss for 6 months EXAM: THYROID SCAN AND UPTAKE - 4 AND 24 HOURS TECHNIQUE: Following oral administration of I-123 capsule, anterior planar imaging was acquired at 24 hours. Thyroid uptake was calculated with a thyroid probe at 4-6 hours and 24 hours. RADIOPHARMACEUTICALS:  434 uCi I-123 sodium iodide p.o. COMPARISON:  None Correlation: Thyroid ultrasound 06/08/2021 FINDINGS: Homogeneous tracer distribution throughout both thyroid lobes. No focal areas of increased or decreased tracer localization seen. 4 hour I-123 uptake = 26.2% (normal 5-20%) 24 hour I-123 uptake = 51.0% (normal 10-30%) IMPRESSION: Normal thyroid scan with elevated 4 hour and 24 hour radio iodine uptakes consistent with Graves disease. Electronically Signed   By: Ulyses Southward M.D.   On: 07/14/2021 17:12   US THYROID  Result Date: 06/08/2021 CLINICAL DATA:  Hyperthyroid. EXAM: THYROID ULTRASOUND TECHNIQUE: Ultrasound examination of the thyroid gland and adjacent soft tissues was performed. COMPARISON:  None. FINDINGS: Parenchymal Echotexture: Moderately heterogenous Isthmus: 0.2 cm Right lobe: 4.6 x 2.0 x 1.4 cm Left lobe: 4.2 x  1.7 x 1.4 cm _________________________________________________________ Estimated total number of nodules >/= 1 cm: 1 Number of spongiform nodules >/=  2 cm not described below (TR1): 0 Number of mixed cystic and solid nodules >/= 1.5 cm not described below (TR2): 0 _________________________________________________________ Nodule # 2: Location: Left; Superior Maximum size: 1.7 cm; Other 2 dimensions: 1.4 x 1.1 cm Composition: solid/almost completely solid (2) Echogenicity: hypoechoic (2) Shape: not taller-than-wide (0) Margins: smooth (0) Echogenic foci: none (0) ACR TI-RADS total points: 4. ACR TI-RADS risk category: TR4 (4-6 points). ACR TI-RADS recommendations: **Given size (>/= 1.5 cm) and appearance, fine needle aspiration of this moderately suspicious nodule should be considered based on TI-RADS criteria. _________________________________________________________ Additional small subcentimeter thyroid nodules bilaterally which do not meet criteria to warrant further evaluation. IMPRESSION: Approximately 1.7 cm TI-RADS category 4 nodule in the left superior gland meets criteria to consider fine-needle aspiration biopsy. The background thyroid gland is moderately heterogeneous in appearance. The above is in keeping with the ACR TI-RADS recommendations - J Am Coll Radiol 2017;14:587-595. Electronically Signed   By: Malachy Moan M.D.   On: 06/08/2021 11:46      ADDITIONAL PHYSICIAN COMMENTS/NOTES   AUTHORIZED USER SIGNATURE & TIME STAMP:

## 2021-07-28 NOTE — Written Directive (Addendum)
MOLECULAR IMAGING AND THERAPEUTICS WRITTEN DIRECTIVE   PATIENT NAME: Monica Cisneros  PT DOB:   [birth year]                                              MRN: [id number]  ---------------------------------------------------------------------------------------------------------------------   I-131 WHOLE THYROID THERAPY (NON-CANCER)    RADIOPHARMACEUTICAL:   Iodine-131 Capsule    PRESCRIBED DOSE FOR ADMINISTRATION: 15 mCi   ROUTE OFADMINISTRATION: PO   DIAGNOSIS:  Graves Disease   REFERRING PHYSICIAN: Whitney Reardon/Dr. Fransico Him   TSH:    Lab Results  Component Value Date   TSH 0.01 (A) 05/26/2021     PRIOR I-131 THERAPY (Date and Dose): N/A   PRIOR RADIOLOGY EXAMS (Results and Date): NM THYROID MULT UPTAKE W/IMAGING  Result Date: 07/14/2021 CLINICAL DATA:  Hyperthyroidism, suppressed TSH 0.01, family history of Graves disease, weight loss for 6 months EXAM: THYROID SCAN AND UPTAKE - 4 AND 24 HOURS TECHNIQUE: Following oral administration of I-123 capsule, anterior planar imaging was acquired at 24 hours. Thyroid uptake was calculated with a thyroid probe at 4-6 hours and 24 hours. RADIOPHARMACEUTICALS:  434 uCi I-123 sodium iodide p.o. COMPARISON:  None Correlation: Thyroid ultrasound 06/08/2021 FINDINGS: Homogeneous tracer distribution throughout both thyroid lobes. No focal areas of increased or decreased tracer localization seen. 4 hour I-123 uptake = 26.2% (normal 5-20%) 24 hour I-123 uptake = 51.0% (normal 10-30%) IMPRESSION: Normal thyroid scan with elevated 4 hour and 24 hour radio iodine uptakes consistent with Graves disease. Electronically Signed   By: Ulyses Southward M.D.   On: 07/14/2021 17:12   US THYROID  Result Date: 06/08/2021 CLINICAL DATA:  Hyperthyroid. EXAM: THYROID ULTRASOUND TECHNIQUE: Ultrasound examination of the thyroid gland and adjacent soft tissues was performed. COMPARISON:  None. FINDINGS: Parenchymal Echotexture: Moderately heterogenous Isthmus: 0.2 cm  Right lobe: 4.6 x 2.0 x 1.4 cm Left lobe: 4.2 x 1.7 x 1.4 cm _________________________________________________________ Estimated total number of nodules >/= 1 cm: 1 Number of spongiform nodules >/=  2 cm not described below (TR1): 0 Number of mixed cystic and solid nodules >/= 1.5 cm not described below (TR2): 0 _________________________________________________________ Nodule # 2: Location: Left; Superior Maximum size: 1.7 cm; Other 2 dimensions: 1.4 x 1.1 cm Composition: solid/almost completely solid (2) Echogenicity: hypoechoic (2) Shape: not taller-than-wide (0) Margins: smooth (0) Echogenic foci: none (0) ACR TI-RADS total points: 4. ACR TI-RADS risk category: TR4 (4-6 points). ACR TI-RADS recommendations: **Given size (>/= 1.5 cm) and appearance, fine needle aspiration of this moderately suspicious nodule should be considered based on TI-RADS criteria. _________________________________________________________ Additional small subcentimeter thyroid nodules bilaterally which do not meet criteria to warrant further evaluation. IMPRESSION: Approximately 1.7 cm TI-RADS category 4 nodule in the left superior gland meets criteria to consider fine-needle aspiration biopsy. The background thyroid gland is moderately heterogeneous in appearance. The above is in keeping with the ACR TI-RADS recommendations - [employer] Quadros Am Coll Radiol 2017;14:587-595. Electronically Signed   By: Malachy Moan M.D.   On: 06/08/2021 11:46      ADDITIONAL PHYSICIAN COMMENTS/NOTES symptoms of irritability, unintentional weight loss, tremors, and insomnia for about 6 months. These symptoms are progressively worsening and troubling to her.  thyroid labs revealed suppressed TSH of < 0.01 and elevated Free T4 of 2.3 on 05/26/21.  Elevated Iodine uptake with depressed TSH/  Imaging and findings consistent with Graves Disease  AUTHORIZED USER SIGNATURE & TIME STAMP: Patriciaann Clan, MD   07/28/21    12:17 PM

## 2021-07-29 ENCOUNTER — Other Ambulatory Visit: Payer: Self-pay

## 2021-07-29 ENCOUNTER — Encounter (HOSPITAL_COMMUNITY)
Admission: RE | Admit: 2021-07-29 | Discharge: 2021-07-29 | Disposition: A | Discharge status: Home / Self Care | Payer: Medicaid Other | Source: Ambulatory Visit | Attending: Nurse Practitioner | Admitting: Nurse Practitioner

## 2021-07-29 DIAGNOSIS — E0590 Thyrotoxicosis, unspecified without thyrotoxic crisis or storm: Secondary | ICD-10-CM | POA: Insufficient documentation

## 2021-07-29 DIAGNOSIS — E0500 Thyrotoxicosis with diffuse goiter without thyrotoxic crisis or storm: Secondary | ICD-10-CM | POA: Diagnosis present

## 2021-07-29 MED ORDER — SODIUM IODIDE I 131 CAPSULE
14.9700 | Freq: Once | INTRAVENOUS | Status: AC | PRN
  Administered 2021-07-29: 14.97 via ORAL

## 2021-09-07 ENCOUNTER — Other Ambulatory Visit (HOSPITAL_COMMUNITY)
Admission: RE | Admit: 2021-09-07 | Discharge: 2021-09-07 | Disposition: A | Discharge status: Home / Self Care | Payer: Medicaid Other | Source: Ambulatory Visit | Attending: Nurse Practitioner | Admitting: Nurse Practitioner

## 2021-09-07 DIAGNOSIS — E0500 Thyrotoxicosis with diffuse goiter without thyrotoxic crisis or storm: Secondary | ICD-10-CM | POA: Insufficient documentation

## 2021-09-07 DIAGNOSIS — E0590 Thyrotoxicosis, unspecified without thyrotoxic crisis or storm: Secondary | ICD-10-CM | POA: Diagnosis not present

## 2021-09-07 LAB — TSH: TSH: <0.01 u[IU]/mL — ABNORMAL LOW (ref 0.350–4.500)

## 2021-09-07 LAB — T4, FREE: Free T4: 0.91 ng/dL (ref 0.61–1.12)

## 2021-09-08 LAB — T3, FREE: T3, Free: 2.4 pg/mL (ref 2.0–4.4)

## 2021-09-16 ENCOUNTER — Other Ambulatory Visit: Payer: Self-pay

## 2021-09-16 ENCOUNTER — Encounter: Payer: Self-pay | Admitting: Nurse Practitioner

## 2021-09-16 ENCOUNTER — Ambulatory Visit: Payer: Medicaid Other | Admitting: Nurse Practitioner

## 2021-09-16 VITALS — BP 119/73 | HR 90 | Ht 62.25 in | Wt 126.0 lb

## 2021-09-16 DIAGNOSIS — E0590 Thyrotoxicosis, unspecified without thyrotoxic crisis or storm: Secondary | ICD-10-CM

## 2021-09-16 DIAGNOSIS — E0500 Thyrotoxicosis with diffuse goiter without thyrotoxic crisis or storm: Secondary | ICD-10-CM

## 2021-09-16 NOTE — Progress Notes (Signed)
09/16/2021     Endocrinology Follow Up Note    Subjective:    Patient ID: Monica Cisneros, female    DOB: Apr 24, 1961, PCP Zhou-Talbert, Ralene Bathe, MD.   Past Medical History:  Diagnosis Date   Diabetes mellitus, type II (HCC)    Hyperthyroidism     History reviewed. No pertinent surgical history.  Social History   Socioeconomic History   Marital status: Married    Spouse name: Not on file   Number of children: Not on file   Years of education: Not on file   Highest education level: Not on file  Occupational History   Not on file  Tobacco Use   Smoking status: Every Day    Packs/day: 1.00    Types: Cigarettes   Smokeless tobacco: Never  Vaping Use   Vaping Use: Never used  Substance and Sexual Activity   Alcohol use: Not Currently   Drug use: No   Sexual activity: Not on file  Other Topics Concern   Not on file  Social History Narrative   Not on file   Social Determinants of Health   Financial Resource Strain: Not on file  Food Insecurity: Not on file  Transportation Needs: Not on file  Physical Activity: Not on file  Stress: Not on file  Social Connections: Not on file    History reviewed. No pertinent family history.  Outpatient Encounter Medications as of 09/16/2021  Medication Sig   Ascorbic Acid (VITAMIN C) 1000 MG tablet 1 tablet   aspirin 81 MG chewable tablet Chew 81 mg by mouth daily.   atorvastatin (LIPITOR) 40 MG tablet Take 40 mg by mouth daily.   cholecalciferol (VITAMIN D3) 25 MCG (1000 UNIT) tablet 1 tablet   cyclobenzaprine (FLEXERIL) 10 MG tablet Take 10 mg by mouth 3 (three) times daily.   escitalopram (LEXAPRO) 10 MG tablet Take 10 mg by mouth daily.   hydrOXYzine (ATARAX/VISTARIL) 25 MG tablet 1 tablet as needed   insulin detemir (LEVEMIR FLEXTOUCH) 100 UNIT/ML FlexPen Inject 15 Units into the skin at bedtime.   lisinopril (ZESTRIL) 10 MG tablet Take 10 mg by mouth daily.   metFORMIN (GLUCOPHAGE) 500 MG tablet Take 500 mg by  mouth 2 (two) times daily.   naproxen (NAPROSYN) 500 MG tablet Take 500 mg by mouth 2 (two) times daily. For neck   Omega-3 Fatty Acids (FISH OIL) 1000 MG CAPS 1 capsule   No facility-administered encounter medications on file as of 09/16/2021.    ALLERGIES: No Known Allergies  VACCINATION STATUS:  There is no immunization history on file for this patient.   HPI  Monica Cisneros is 60 y.o. female who presents today with a medical history as above. she is being seen in follow up after being seen in consultation for hyperthyroidism requested by Zhou-Talbert, Ralene Bathe, MD.  she has been dealing with symptoms of irritability, unintentional weight loss, tremors, and insomnia for about 6 months. These symptoms are progressively worsening and troubling to her.  her most recent thyroid labs revealed suppressed TSH of < 0.01 and elevated Free T4 of 2.3 on 05/26/21.  she denies dysphagia, choking, shortness of breath, no recent voice change.    she does have extensive family history of thyroid dysfunction in her daughters, siblings, and parents, but denies family hx of thyroid cancer. she denies personal history of goiter. she is not on any anti-thyroid medications nor on any thyroid hormone supplements. Denies use of Biotin containing supplements.  she  is willing to proceed with appropriate work up and therapy for thyrotoxicosis.  She did have thyroid ultrasound on 06/08/21 showing moderately suspicious nodule in the left upper lobe which recommended biopsy which she has scheduled for next Thursday.  Review of systems  Constitutional: + Minimally fluctuating body weight,  current Body mass index is 22.86 kg/m. , no fatigue, no subjective hyperthermia, no subjective hypothermia Eyes: no blurry vision, no xerophthalmia ENT: no sore throat, no nodules palpated in throat, no dysphagia/odynophagia, no hoarseness Cardiovascular: no chest pain, no shortness of breath, no palpitations, no leg  swelling Respiratory: no cough, no shortness of breath Gastrointestinal: no nausea/vomiting/diarrhea Musculoskeletal: no muscle/joint aches Skin: no rashes, no hyperemia Neurological: no tremors, no numbness, no tingling, no dizziness Psychiatric: no depression, no anxiety   Objective:    BP 119/73   Pulse 90   Ht 5' 2.25" (1.581 m)   Wt 126 lb (57.2 kg)   LMP  (LMP Unknown)   BMI 22.86 kg/m   Wt Readings from Last 3 Encounters:  09/16/21 126 lb (57.2 kg)  07/19/21 125 lb 6.4 oz (56.9 kg)  06/24/21 119 lb 9.6 oz (54.3 kg)     BP Readings from Last 3 Encounters:  09/16/21 119/73  07/19/21 (!) 163/77  06/24/21 105/63                          Physical Exam- Limited  Constitutional:  Body mass index is 22.86 kg/m. , not in acute distress, normal state of mind Eyes:  EOMI, no exophthalmos Neck: Supple Cardiovascular: RRR, no murmurs, rubs, or gallops, no edema Respiratory: Adequate breathing efforts, no crackles, rales, rhonchi, or wheezing Musculoskeletal: no gross deformities, strength intact in all four extremities, no gross restriction of joint movements Skin:  no rashes, no hyperemia Neurological: no tremor with outstretched hands   CMP     Component Value Date/Time   NA 139 09/23/2014 2114   K 3.7 09/23/2014 2114   CL 101 09/23/2014 2114   CO2 22 09/23/2014 2114   GLUCOSE 168 (H) 09/23/2014 2114   BUN 10 09/23/2014 2114   CREATININE 0.79 09/23/2014 2114   CALCIUM 9.5 09/23/2014 2114   GFRNONAA >90 09/23/2014 2114   GFRAA >90 09/23/2014 2114     CBC    Component Value Date/Time   WBC 14.9 (H) 09/23/2014 2114   RBC 4.63 09/23/2014 2114   HGB 13.8 09/23/2014 2114   HCT 41.3 09/23/2014 2114   PLT 255 09/23/2014 2114   MCV 89.2 09/23/2014 2114   MCH 29.8 09/23/2014 2114   MCHC 33.4 09/23/2014 2114   RDW 13.1 09/23/2014 2114   LYMPHSABS 2.7 09/23/2014 2114   MONOABS 1.1 (H) 09/23/2014 2114   EOSABS 0.2 09/23/2014 2114   BASOSABS 0.0 09/23/2014  2114     Diabetic Labs (most recent): No results found for: HGBA1C  Lipid Panel  No results found for: CHOL, TRIG, HDL, CHOLHDL, VLDL, LDLCALC, LDLDIRECT, LABVLDL   Lab Results  Component Value Date   TSH <0.010 (L) 09/07/2021   TSH 0.01 (A) 05/26/2021   FREET4 0.91 09/07/2021     Thyroid US from 06/08/2021 CLINICAL DATA:  Hyperthyroid.   EXAM: THYROID ULTRASOUND   TECHNIQUE: Ultrasound examination of the thyroid gland and adjacent soft tissues was performed.   COMPARISON:  None.   FINDINGS: Parenchymal Echotexture: Moderately heterogenous   Isthmus: 0.2 cm   Right lobe: 4.6 x 2.0 x 1.4 cm   Left lobe:  4.2 x 1.7 x 1.4 cm   _________________________________________________________   Estimated total number of nodules >/= 1 cm: 1   Number of spongiform nodules >/=  2 cm not described below (TR1): 0   Number of mixed cystic and solid nodules >/= 1.5 cm not described below (TR2): 0   _________________________________________________________   Nodule # 2:   Location: Left; Superior   Maximum size: 1.7 cm; Other 2 dimensions: 1.4 x 1.1 cm   Composition: solid/almost completely solid (2)   Echogenicity: hypoechoic (2)   Shape: not taller-than-wide (0)   Margins: smooth (0)   Echogenic foci: none (0)   ACR TI-RADS total points: 4.   ACR TI-RADS risk category: TR4 (4-6 points).   ACR TI-RADS recommendations:   **Given size (>/= 1.5 cm) and appearance, fine needle aspiration of this moderately suspicious nodule should be considered based on TI-RADS criteria.   _________________________________________________________   Additional small subcentimeter thyroid nodules bilaterally which do not meet criteria to warrant further evaluation.   IMPRESSION: Approximately 1.7 cm TI-RADS category 4 nodule in the left superior gland meets criteria to consider fine-needle aspiration biopsy.   The background thyroid gland is moderately heterogeneous  in appearance.   The above is in keeping with the ACR TI-RADS recommendations - J Am Coll Radiol 2017;14:587-595.     Electronically Signed   By: Malachy Moan M.D.   On: 06/08/2021 11:46 -----------------------------------------------------------------------------------------------------------------------------  Uptake and Scan from 07/14/21 CLINICAL DATA:  Hyperthyroidism, suppressed TSH 0.01, family history of Graves disease, weight loss for 6 months   EXAM: THYROID SCAN AND UPTAKE - 4 AND 24 HOURS   TECHNIQUE: Following oral administration of I-123 capsule, anterior planar imaging was acquired at 24 hours. Thyroid uptake was calculated with a thyroid probe at 4-6 hours and 24 hours.   RADIOPHARMACEUTICALS:  434 uCi I-123 sodium iodide p.o.   COMPARISON:  None   Correlation: Thyroid ultrasound 06/08/2021   FINDINGS: Homogeneous tracer distribution throughout both thyroid lobes.   No focal areas of increased or decreased tracer localization seen.   4 hour I-123 uptake = 26.2% (normal 5-20%)   24 hour I-123 uptake = 51.0% (normal 10-30%)   IMPRESSION: Normal thyroid scan with elevated 4 hour and 24 hour radio iodine uptakes consistent with Graves disease.     Electronically Signed   By: Ulyses Southward M.D.   On: 07/14/2021 17:12    Latest Reference Range & Units 05/26/21 00:00 09/07/21 10:10  TSH 0.350 - 4.500 uIU/mL 0.01 ! (E) <0.010 (L)  Triiodothyronine,Free,Serum 2.0 - 4.4 pg/mL  2.4  T4,Free(Direct) 0.61 - 1.12 ng/dL  1.61  !: Data is abnormal (L): Data is abnormally low (E): External lab result  Assessment & Plan:   1. Hyperthyroidism- r/t Graves Disease  she is being seen at a kind request of Zhou-Talbert, Ralene Bathe, MD.  -Her uptake and scan shows 4-hr uptake of 26.2%, and 24-hr uptake of 51%, confirming suspicion of Graves disease.    -She will not need to proceed with biopsy of suspicious nodule as the likelihood of thyroid cancer AND Graves  disease is very rare.  -She had RAI ablation in October 2022.  Her previsit thyroid function studies show improvement but not yet ready for thyroid hormone replacement at this time.  Will repeat thyroid function tests prior to next visit in 4 weeks to assess need for thyroid hormone replacement therapy.     -Patient is advised to maintain close follow up with Zhou-Talbert, Ralene Bathe, MD  for primary care needs.    I spent 30 minutes in the care of the patient today including review of labs from Thyroid Function, CMP, and other relevant labs ; imaging/biopsy records (current and previous including abstractions from other facilities); face-to-face time discussing  her lab results and symptoms, medications doses, her options of short and long term treatment based on the latest standards of care / guidelines;   and documenting the encounter.  Monica Cisneros  participated in the discussions, expressed understanding, and voiced agreement with the above plans.  All questions were answered to her satisfaction. she is encouraged to contact clinic should she have any questions or concerns prior to her return visit.  Follow up plan: Return in about 4 weeks (around 10/14/2021) for Thyroid follow up, Previsit labs.   Thank you for involving me in the care of this pleasant patient, and I will continue to update you with her progress.    Ronny Bacon, [facility] [address] [place], [place] [address] Phone: [phone] Fax: [phone]  09/16/2021, 11:42 AM

## 2021-09-28 ENCOUNTER — Other Ambulatory Visit: Payer: Self-pay

## 2021-09-28 ENCOUNTER — Other Ambulatory Visit (HOSPITAL_COMMUNITY)
Admission: RE | Admit: 2021-09-28 | Discharge: 2021-09-28 | Disposition: A | Discharge status: Home / Self Care | Payer: Medicaid Other | Source: Ambulatory Visit | Attending: Nurse Practitioner | Admitting: Nurse Practitioner

## 2021-09-28 DIAGNOSIS — E0590 Thyrotoxicosis, unspecified without thyrotoxic crisis or storm: Secondary | ICD-10-CM | POA: Diagnosis present

## 2021-09-28 DIAGNOSIS — E0500 Thyrotoxicosis with diffuse goiter without thyrotoxic crisis or storm: Secondary | ICD-10-CM | POA: Insufficient documentation

## 2021-09-28 LAB — TSH: TSH: 0.245 u[IU]/mL — ABNORMAL LOW (ref 0.350–4.500)

## 2021-09-28 LAB — T4, FREE: Free T4: 0.38 ng/dL — ABNORMAL LOW (ref 0.61–1.12)

## 2021-09-29 LAB — T3, FREE: T3, Free: 1 pg/mL — ABNORMAL LOW (ref 2.0–4.4)

## 2021-10-13 NOTE — Patient Instructions (Signed)

## 2021-10-14 ENCOUNTER — Other Ambulatory Visit: Payer: Self-pay

## 2021-10-14 ENCOUNTER — Encounter: Payer: Self-pay | Admitting: Nurse Practitioner

## 2021-10-14 ENCOUNTER — Ambulatory Visit: Payer: Medicaid Other | Admitting: Nurse Practitioner

## 2021-10-14 VITALS — BP 174/89 | HR 68 | Ht 62.25 in | Wt 134.0 lb

## 2021-10-14 DIAGNOSIS — E890 Postprocedural hypothyroidism: Secondary | ICD-10-CM

## 2021-10-14 DIAGNOSIS — E0500 Thyrotoxicosis with diffuse goiter without thyrotoxic crisis or storm: Secondary | ICD-10-CM

## 2021-10-14 DIAGNOSIS — E0590 Thyrotoxicosis, unspecified without thyrotoxic crisis or storm: Secondary | ICD-10-CM

## 2021-10-14 MED ORDER — LEVOTHYROXINE SODIUM 50 MCG PO TABS: 50.0000 ug | ORAL_TABLET | Freq: Every day | ORAL | 1 refills | Status: DC

## 2021-10-14 NOTE — Progress Notes (Signed)
10/14/2021     Endocrinology Follow Up Note    Subjective:    Patient ID: Monica Cisneros, female    DOB: 11/04/1960, PCP Zhou-Talbert, Ralene BatheSerena S, MD (Inactive).   Past Medical History:  Diagnosis Date   Diabetes mellitus, type II (HCC)    Hyperthyroidism     History reviewed. No pertinent surgical history.  Social History   Socioeconomic History   Marital status: Married    Spouse name: Not on file   Number of children: Not on file   Years of education: Not on file   Highest education level: Not on file  Occupational History   Not on file  Tobacco Use   Smoking status: Every Day    Packs/day: 1.00    Types: Cigarettes   Smokeless tobacco: Never  Vaping Use   Vaping Use: Never used  Substance and Sexual Activity   Alcohol use: Not Currently   Drug use: No   Sexual activity: Not on file  Other Topics Concern   Not on file  Social History Narrative   Not on file   Social Determinants of Health   Financial Resource Strain: Not on file  Food Insecurity: Not on file  Transportation Needs: Not on file  Physical Activity: Not on file  Stress: Not on file  Social Connections: Not on file    History reviewed. No pertinent family history.  Outpatient Encounter Medications as of 10/14/2021  Medication Sig   Ascorbic Acid (VITAMIN C) 1000 MG tablet 1 tablet   aspirin 81 MG chewable tablet Chew 81 mg by mouth daily.   atorvastatin (LIPITOR) 40 MG tablet Take 40 mg by mouth daily.   cholecalciferol (VITAMIN D3) 25 MCG (1000 UNIT) tablet 1 tablet   cyclobenzaprine (FLEXERIL) 10 MG tablet Take 10 mg by mouth 3 (three) times daily.   escitalopram (LEXAPRO) 10 MG tablet Take 10 mg by mouth daily.   hydrOXYzine (ATARAX/VISTARIL) 25 MG tablet 1 tablet as needed   insulin detemir (LEVEMIR FLEXTOUCH) 100 UNIT/ML FlexPen Inject 15 Units into the skin at bedtime.   levothyroxine (SYNTHROID) 50 MCG tablet Take 1 tablet (50 mcg total) by mouth daily.   lisinopril  (ZESTRIL) 10 MG tablet Take 10 mg by mouth daily.   metFORMIN (GLUCOPHAGE) 500 MG tablet Take 500 mg by mouth 2 (two) times daily.   naproxen (NAPROSYN) 500 MG tablet Take 500 mg by mouth 2 (two) times daily. For neck   Omega-3 Fatty Acids (FISH OIL) 1000 MG CAPS 1 capsule   No facility-administered encounter medications on file as of 10/14/2021.    ALLERGIES: No Known Allergies  VACCINATION STATUS:  There is no immunization history on file for this patient.   HPI  Monica Glatterammy Lemke is 61 y.o. female who presents today with a medical history as above. she is being seen in follow up after being seen in consultation for hyperthyroidism requested by Zhou-Talbert, Ralene BatheSerena S, MD (Inactive).  she has been dealing with symptoms of irritability, unintentional weight loss, tremors, and insomnia for about 6 months. These symptoms are progressively worsening and troubling to her.  her most recent thyroid labs revealed suppressed TSH of < 0.01 and elevated Free T4 of 2.3 on 05/26/21.  she denies dysphagia, choking, shortness of breath, no recent voice change.    she does have extensive family history of thyroid dysfunction in her daughters, siblings, and parents, but denies family hx of thyroid cancer. she denies personal history of goiter. she is not  on any anti-thyroid medications nor on any thyroid hormone supplements. Denies use of Biotin containing supplements.  she is willing to proceed with appropriate work up and therapy for thyrotoxicosis.   Review of systems  Constitutional: + regaining some weight previously lost unintentionally,  current Body mass index is 24.31 kg/m. , no fatigue, no subjective hyperthermia, no subjective hypothermia Eyes: no blurry vision, no xerophthalmia ENT: no sore throat, no nodules palpated in throat, no dysphagia/odynophagia, no hoarseness Cardiovascular: no chest pain, no shortness of breath, no palpitations, no leg swelling Respiratory: no cough, no shortness of  breath Gastrointestinal: no nausea/vomiting/diarrhea Musculoskeletal: no muscle/joint aches, restless legs- especially at night Skin: no rashes, no hyperemia Neurological: no tremors, no numbness, no tingling, no dizziness Psychiatric: no depression, no anxiety   Objective:    BP (!) 174/89    Pulse 68    Ht 5' 2.25" (1.581 m)    Wt 134 lb (60.8 kg)    LMP  (LMP Unknown)    SpO2 100%    BMI 24.31 kg/m   Wt Readings from Last 3 Encounters:  10/14/21 134 lb (60.8 kg)  09/16/21 126 lb (57.2 kg)  07/19/21 125 lb 6.4 oz (56.9 kg)     BP Readings from Last 3 Encounters:  10/14/21 (!) 174/89  09/16/21 119/73  07/19/21 (!) 163/77                         Physical Exam- Limited  Constitutional:  Body mass index is 24.31 kg/m. , not in acute distress, normal state of mind Eyes:  EOMI, no exophthalmos Neck: Supple Cardiovascular: RRR, no murmurs, rubs, or gallops, no edema Respiratory: Adequate breathing efforts, no crackles, rales, rhonchi, or wheezing Musculoskeletal: no gross deformities, strength intact in all four extremities, no gross restriction of joint movements Skin:  no rashes, no hyperemia Neurological: no tremor with outstretched hands   CMP     Component Value Date/Time   NA 139 09/23/2014 2114   K 3.7 09/23/2014 2114   CL 101 09/23/2014 2114   CO2 22 09/23/2014 2114   GLUCOSE 168 (H) 09/23/2014 2114   BUN 10 09/23/2014 2114   CREATININE 0.79 09/23/2014 2114   CALCIUM 9.5 09/23/2014 2114   GFRNONAA >90 09/23/2014 2114   GFRAA >90 09/23/2014 2114     CBC    Component Value Date/Time   WBC 14.9 (H) 09/23/2014 2114   RBC 4.63 09/23/2014 2114   HGB 13.8 09/23/2014 2114   HCT 41.3 09/23/2014 2114   PLT 255 09/23/2014 2114   MCV 89.2 09/23/2014 2114   MCH 29.8 09/23/2014 2114   MCHC 33.4 09/23/2014 2114   RDW 13.1 09/23/2014 2114   LYMPHSABS 2.7 09/23/2014 2114   MONOABS 1.1 (H) 09/23/2014 2114   EOSABS 0.2 09/23/2014 2114   BASOSABS 0.0 09/23/2014  2114     Diabetic Labs (most recent): No results found for: HGBA1C  Lipid Panel  No results found for: CHOL, TRIG, HDL, CHOLHDL, VLDL, LDLCALC, LDLDIRECT, LABVLDL   Lab Results  Component Value Date   TSH 0.245 (L) 09/28/2021   TSH <0.010 (L) 09/07/2021   TSH 0.01 (A) 05/26/2021   FREET4 0.38 (L) 09/28/2021   FREET4 0.91 09/07/2021     Thyroid US from 06/08/2021 CLINICAL DATA:  Hyperthyroid.   EXAM: THYROID ULTRASOUND   TECHNIQUE: Ultrasound examination of the thyroid gland and adjacent soft tissues was performed.   COMPARISON:  None.   FINDINGS: Parenchymal Echotexture: Moderately  heterogenous   Isthmus: 0.2 cm   Right lobe: 4.6 x 2.0 x 1.4 cm   Left lobe: 4.2 x 1.7 x 1.4 cm   _________________________________________________________   Estimated total number of nodules >/= 1 cm: 1   Number of spongiform nodules >/=  2 cm not described below (TR1): 0   Number of mixed cystic and solid nodules >/= 1.5 cm not described below (TR2): 0   _________________________________________________________   Nodule # 2:   Location: Left; Superior   Maximum size: 1.7 cm; Other 2 dimensions: 1.4 x 1.1 cm   Composition: solid/almost completely solid (2)   Echogenicity: hypoechoic (2)   Shape: not taller-than-wide (0)   Margins: smooth (0)   Echogenic foci: none (0)   ACR TI-RADS total points: 4.   ACR TI-RADS risk category: TR4 (4-6 points).   ACR TI-RADS recommendations:   **Given size (>/= 1.5 cm) and appearance, fine needle aspiration of this moderately suspicious nodule should be considered based on TI-RADS criteria.   _________________________________________________________   Additional small subcentimeter thyroid nodules bilaterally which do not meet criteria to warrant further evaluation.   IMPRESSION: Approximately 1.7 cm TI-RADS category 4 nodule in the left superior gland meets criteria to consider fine-needle aspiration biopsy.   The  background thyroid gland is moderately heterogeneous in appearance.   The above is in keeping with the ACR TI-RADS recommendations - J Am Coll Radiol 2017;14:587-595.     Electronically Signed   By: Malachy Moan M.D.   On: 06/08/2021 11:46 -----------------------------------------------------------------------------------------------------------------------------  Uptake and Scan from 07/14/21 CLINICAL DATA:  Hyperthyroidism, suppressed TSH 0.01, family history of Graves disease, weight loss for 6 months   EXAM: THYROID SCAN AND UPTAKE - 4 AND 24 HOURS   TECHNIQUE: Following oral administration of I-123 capsule, anterior planar imaging was acquired at 24 hours. Thyroid uptake was calculated with a thyroid probe at 4-6 hours and 24 hours.   RADIOPHARMACEUTICALS:  434 uCi I-123 sodium iodide p.o.   COMPARISON:  None   Correlation: Thyroid ultrasound 06/08/2021   FINDINGS: Homogeneous tracer distribution throughout both thyroid lobes.   No focal areas of increased or decreased tracer localization seen.   4 hour I-123 uptake = 26.2% (normal 5-20%)   24 hour I-123 uptake = 51.0% (normal 10-30%)   IMPRESSION: Normal thyroid scan with elevated 4 hour and 24 hour radio iodine uptakes consistent with Graves disease.     Electronically Signed   By: Ulyses Southward M.D.   On: 07/14/2021 17:12    Latest Reference Range & Units 05/26/21 00:00 09/07/21 10:10 09/28/21 10:25 09/28/21 10:26  TSH 0.350 - 4.500 uIU/mL 0.01 ! (E) <0.010 (L)  0.245 (L)  Triiodothyronine,Free,Serum 2.0 - 4.4 pg/mL  2.4 1.0 (L)   T4,Free(Direct) 0.61 - 1.12 ng/dL  9.93  5.70 (L)  !: Data is abnormal (L): Data is abnormally low (E): External lab result  Assessment & Plan:   1. 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  -Her uptake and scan shows 4-hr uptake of 26.2%, and 24-hr uptake of 51%,  confirming suspicion of Graves disease.    -She will not need to proceed with biopsy of suspicious nodule as the likelihood of thyroid cancer AND Graves disease is very rare.  -She had RAI ablation in October 2022.    Her previsit thyroid function tests continue to show improvement, indicative of a successful ablation treatment.  She is now ready to start thyroid hormone replacement.  I discussed and initiated treatment with Levothyroxine 50 mcg po daily before breakfast.   - The correct intake of thyroid hormone (Levothyroxine, Synthroid), is on empty stomach first thing in the morning, with water, separated by at least 30 minutes from breakfast and other medications,  and separated by more than 4 hours from calcium, iron, multivitamins, acid reflux medications (PPIs).  - This medication is a life-long medication and will be needed to correct thyroid hormone imbalances for the rest of your life.  The dose may change from time to time, based on thyroid blood work.  - It is extremely important to be consistent taking this medication, near the same time each morning.  -AVOID TAKING PRODUCTS CONTAINING BIOTIN (commonly found in Hair, Skin, Nails vitamins) AS IT INTERFERES WITH THE VALIDITY OF THYROID FUNCTION BLOOD TESTS.     -Patient is advised to maintain close follow up with Zhou-Talbert, Ralene BatheSerena S, MD (Inactive) for primary care needs.    I spent 22 minutes in the care of the patient today including review of labs from Thyroid Function, CMP, and other relevant labs ; imaging/biopsy records (current and previous including abstractions from other facilities); face-to-face time discussing  her lab results and symptoms, medications doses, her options of short and long term treatment based on the latest standards of care / guidelines;   and documenting the encounter.  Emalie Shiflett  participated in the discussions, expressed understanding, and voiced agreement with the above plans.  All questions  were answered to her satisfaction. she is encouraged to contact clinic should she have any questions or concerns prior to her return visit.  Follow up plan: Return in about 6 weeks (around 11/25/2021) for Thyroid follow up, Previsit labs.   Thank you for involving me in the care of this pleasant patient, and I will continue to update you with her progress.    Ronny BaconWhitney Deshundra Waller, [facility] HospitalFNP-BC [facility] [address] [place], [place] [address] Phone: [phone] Fax: [phone]  10/14/2021, 10:11 AM

## 2021-11-12 ENCOUNTER — Other Ambulatory Visit (HOSPITAL_COMMUNITY)
Admission: RE | Admit: 2021-11-12 | Discharge: 2021-11-12 | Disposition: A | Discharge status: Home / Self Care | Payer: Medicaid Other | Source: Ambulatory Visit | Attending: Nurse Practitioner | Admitting: Nurse Practitioner

## 2021-11-12 DIAGNOSIS — E890 Postprocedural hypothyroidism: Secondary | ICD-10-CM | POA: Diagnosis present

## 2021-11-12 LAB — T4, FREE: Free T4: 0.57 ng/dL — ABNORMAL LOW (ref 0.61–1.12)

## 2021-11-12 LAB — TSH: TSH: 38.296 u[IU]/mL — ABNORMAL HIGH (ref 0.350–4.500)

## 2021-11-24 NOTE — Patient Instructions (Signed)

## 2021-11-25 ENCOUNTER — Other Ambulatory Visit: Payer: Self-pay

## 2021-11-25 ENCOUNTER — Ambulatory Visit: Payer: Medicaid Other | Admitting: Nurse Practitioner

## 2021-11-25 ENCOUNTER — Encounter: Payer: Self-pay | Admitting: Nurse Practitioner

## 2021-11-25 VITALS — BP 171/84 | HR 69 | Ht 62.25 in | Wt 136.0 lb

## 2021-11-25 DIAGNOSIS — E890 Postprocedural hypothyroidism: Secondary | ICD-10-CM | POA: Diagnosis not present

## 2021-11-25 MED ORDER — LEVOTHYROXINE SODIUM 75 MCG PO TABS: 75.0000 ug | ORAL_TABLET | Freq: Every day | ORAL | 0 refills | Status: DC

## 2021-11-25 NOTE — Progress Notes (Signed)
11/25/2021     Endocrinology Follow Up Note    Subjective:    Patient ID: Monica Cisneros, female    DOB: 07/29/1961, PCP Zhou-Talbert, Ralene BatheSerena S, MD.   Past Medical History:  Diagnosis Date   Diabetes mellitus, type II (HCC)    Hyperthyroidism     History reviewed. No pertinent surgical history.  Social History   Socioeconomic History   Marital status: Married    Spouse name: Not on file   Number of children: Not on file   Years of education: Not on file   Highest education level: Not on file  Occupational History   Not on file  Tobacco Use   Smoking status: Every Day    Packs/day: 1.00    Types: Cigarettes   Smokeless tobacco: Never  Vaping Use   Vaping Use: Never used  Substance and Sexual Activity   Alcohol use: Not Currently   Drug use: No   Sexual activity: Not on file  Other Topics Concern   Not on file  Social History Narrative   Not on file   Social Determinants of Health   Financial Resource Strain: Not on file  Food Insecurity: Not on file  Transportation Needs: Not on file  Physical Activity: Not on file  Stress: Not on file  Social Connections: Not on file    History reviewed. No pertinent family history.  Outpatient Encounter Medications as of 11/25/2021  Medication Sig   Ascorbic Acid (VITAMIN C) 1000 MG tablet 1 tablet   aspirin 81 MG chewable tablet Chew 81 mg by mouth daily.   atorvastatin (LIPITOR) 40 MG tablet Take 40 mg by mouth daily.   cholecalciferol (VITAMIN D3) 25 MCG (1000 UNIT) tablet 1 tablet   cyclobenzaprine (FLEXERIL) 10 MG tablet Take 10 mg by mouth 3 (three) times daily.   escitalopram (LEXAPRO) 10 MG tablet Take 10 mg by mouth daily.   hydrOXYzine (ATARAX/VISTARIL) 25 MG tablet 1 tablet as needed   insulin detemir (LEVEMIR FLEXTOUCH) 100 UNIT/ML FlexPen Inject 15 Units into the skin at bedtime.   lisinopril (ZESTRIL) 10 MG tablet Take 10 mg by mouth daily.   metFORMIN (GLUCOPHAGE) 500 MG tablet Take 500 mg by  mouth 2 (two) times daily.   naproxen (NAPROSYN) 500 MG tablet Take 500 mg by mouth 2 (two) times daily. For neck   Omega-3 Fatty Acids (FISH OIL) 1000 MG CAPS 1 capsule   [DISCONTINUED] levothyroxine (SYNTHROID) 50 MCG tablet Take 1 tablet (50 mcg total) by mouth daily.   levothyroxine (SYNTHROID) 75 MCG tablet Take 1 tablet (75 mcg total) by mouth daily.   No facility-administered encounter medications on file as of 11/25/2021.    ALLERGIES: No Known Allergies  VACCINATION STATUS:  There is no immunization history on file for this patient.   HPI  Monica Glatterammy Mallette is 61 y.o. female who presents today with a medical history as above. she is being seen in follow up after being seen in consultation for hyperthyroidism requested by Zhou-Talbert, Ralene BatheSerena S, MD.  she has been dealing with symptoms of irritability, unintentional weight loss, tremors, and insomnia for about 6 months.   she denies dysphagia, choking, shortness of breath, no recent voice change.    she does have extensive family history of thyroid dysfunction in her daughters, siblings, and parents, but denies family hx of thyroid cancer. she denies personal history of goiter. she is not on any anti-thyroid medications nor on any thyroid hormone supplements. Denies use of  Biotin containing supplements.  she is willing to proceed with appropriate work up and therapy for thyrotoxicosis.  Review of systems  Constitutional: + Minimally fluctuating body weight,  current Body mass index is 24.68 kg/m. , no fatigue, no subjective hyperthermia, no subjective hypothermia Eyes: no blurry vision, no xerophthalmia ENT: no sore throat, no nodules palpated in throat, no dysphagia/odynophagia, no hoarseness Cardiovascular: no chest pain, no shortness of breath, no palpitations, no leg swelling Respiratory: no cough, no shortness of breath Gastrointestinal: no nausea/vomiting/diarrhea Musculoskeletal: no muscle/joint aches Skin: no rashes, no  hyperemia Neurological: no tremors, no numbness, no tingling, no dizziness Psychiatric: no depression, no anxiety   Objective:    BP (!) 171/84    Pulse 69    Ht 5' 2.25" (1.581 m)    Wt 136 lb (61.7 kg)    LMP  (LMP Unknown)    SpO2 99%    BMI 24.68 kg/m   Wt Readings from Last 3 Encounters:  11/25/21 136 lb (61.7 kg)  10/14/21 134 lb (60.8 kg)  09/16/21 126 lb (57.2 kg)     BP Readings from Last 3 Encounters:  11/25/21 (!) 171/84  10/14/21 (!) 174/89  09/16/21 119/73                         Physical Exam- Limited  Constitutional:  Body mass index is 24.68 kg/m. , not in acute distress, normal state of mind Eyes:  EOMI, no exophthalmos Neck: Supple Cardiovascular: RRR, no murmurs, rubs, or gallops, no edema Respiratory: Adequate breathing efforts, no crackles, rales, rhonchi, or wheezing Musculoskeletal: no gross deformities, strength intact in all four extremities, no gross restriction of joint movements Skin:  no rashes, no hyperemia Neurological: no tremor with outstretched hands   CMP     Component Value Date/Time   NA 139 09/23/2014 2114   K 3.7 09/23/2014 2114   CL 101 09/23/2014 2114   CO2 22 09/23/2014 2114   GLUCOSE 168 (H) 09/23/2014 2114   BUN 10 09/23/2014 2114   CREATININE 0.79 09/23/2014 2114   CALCIUM 9.5 09/23/2014 2114   GFRNONAA >90 09/23/2014 2114   GFRAA >90 09/23/2014 2114     CBC    Component Value Date/Time   WBC 14.9 (H) 09/23/2014 2114   RBC 4.63 09/23/2014 2114   HGB 13.8 09/23/2014 2114   HCT 41.3 09/23/2014 2114   PLT 255 09/23/2014 2114   MCV 89.2 09/23/2014 2114   MCH 29.8 09/23/2014 2114   MCHC 33.4 09/23/2014 2114   RDW 13.1 09/23/2014 2114   LYMPHSABS 2.7 09/23/2014 2114   MONOABS 1.1 (H) 09/23/2014 2114   EOSABS 0.2 09/23/2014 2114   BASOSABS 0.0 09/23/2014 2114     Diabetic Labs (most recent): No results found for: HGBA1C  Lipid Panel  No results found for: CHOL, TRIG, HDL, CHOLHDL, VLDL, LDLCALC, LDLDIRECT,  LABVLDL   Lab Results  Component Value Date   TSH 38.296 (H) 11/12/2021   TSH 0.245 (L) 09/28/2021   TSH <0.010 (L) 09/07/2021   TSH 0.01 (A) 05/26/2021   FREET4 0.57 (L) 11/12/2021   FREET4 0.38 (L) 09/28/2021   FREET4 0.91 09/07/2021     Thyroid US from 06/08/2021 CLINICAL DATA:  Hyperthyroid.   EXAM: THYROID ULTRASOUND   TECHNIQUE: Ultrasound examination of the thyroid gland and adjacent soft tissues was performed.   COMPARISON:  None.   FINDINGS: Parenchymal Echotexture: Moderately heterogenous   Isthmus: 0.2 cm   Right lobe: 4.6  x 2.0 x 1.4 cm   Left lobe: 4.2 x 1.7 x 1.4 cm   _________________________________________________________   Estimated total number of nodules >/= 1 cm: 1   Number of spongiform nodules >/=  2 cm not described below (TR1): 0   Number of mixed cystic and solid nodules >/= 1.5 cm not described below (TR2): 0   _________________________________________________________   Nodule # 2:   Location: Left; Superior   Maximum size: 1.7 cm; Other 2 dimensions: 1.4 x 1.1 cm   Composition: solid/almost completely solid (2)   Echogenicity: hypoechoic (2)   Shape: not taller-than-wide (0)   Margins: smooth (0)   Echogenic foci: none (0)   ACR TI-RADS total points: 4.   ACR TI-RADS risk category: TR4 (4-6 points).   ACR TI-RADS recommendations:   **Given size (>/= 1.5 cm) and appearance, fine needle aspiration of this moderately suspicious nodule should be considered based on TI-RADS criteria.   _________________________________________________________   Additional small subcentimeter thyroid nodules bilaterally which do not meet criteria to warrant further evaluation.   IMPRESSION: Approximately 1.7 cm TI-RADS category 4 nodule in the left superior gland meets criteria to consider fine-needle aspiration biopsy.   The background thyroid gland is moderately heterogeneous in appearance.   The above is in keeping with the  ACR TI-RADS recommendations - J Am Coll Radiol 2017;14:587-595.     Electronically Signed   By: Malachy Moan M.D.   On: 06/08/2021 11:46 -----------------------------------------------------------------------------------------------------------------------------  Uptake and Scan from 07/14/21 CLINICAL DATA:  Hyperthyroidism, suppressed TSH 0.01, family history of Graves disease, weight loss for 6 months   EXAM: THYROID SCAN AND UPTAKE - 4 AND 24 HOURS   TECHNIQUE: Following oral administration of I-123 capsule, anterior planar imaging was acquired at 24 hours. Thyroid uptake was calculated with a thyroid probe at 4-6 hours and 24 hours.   RADIOPHARMACEUTICALS:  434 uCi I-123 sodium iodide p.o.   COMPARISON:  None   Correlation: Thyroid ultrasound 06/08/2021   FINDINGS: Homogeneous tracer distribution throughout both thyroid lobes.   No focal areas of increased or decreased tracer localization seen.   4 hour I-123 uptake = 26.2% (normal 5-20%)   24 hour I-123 uptake = 51.0% (normal 10-30%)   IMPRESSION: Normal thyroid scan with elevated 4 hour and 24 hour radio iodine uptakes consistent with Graves disease.     Electronically Signed   By: Ulyses Southward M.D.   On: 07/14/2021 17:12    Latest Reference Range & Units 05/26/21 00:00 09/07/21 10:10 09/28/21 10:25 09/28/21 10:26  TSH 0.350 - 4.500 uIU/mL 0.01 ! (E) <0.010 (L)  0.245 (L)  Triiodothyronine,Free,Serum 2.0 - 4.4 pg/mL  2.4 1.0 (L)   T4,Free(Direct) 0.61 - 1.12 ng/dL  3.90  3.00 (L)  !: Data is abnormal (L): Data is abnormally low (E): External lab result    Latest Reference Range & Units 09/07/21 10:10 09/28/21 10:25 09/28/21 10:26 11/12/21 12:38  TSH 0.350 - 4.500 uIU/mL <0.010 (L)  0.245 (L) 38.296 (H)  Triiodothyronine,Free,Serum 2.0 - 4.4 pg/mL 2.4 1.0 (L)    T4,Free(Direct) 0.61 - 1.12 ng/dL 9.23  3.00 (L) 7.62 (L)  (L): Data is abnormally low (H): Data is abnormally high   Assessment & Plan:    1. 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  -Her uptake and scan shows 4-hr uptake of 26.2%, and 24-hr uptake of 51%, confirming suspicion of Graves disease.    -  She will not need to proceed with biopsy of suspicious nodule as the likelihood of thyroid cancer AND Graves disease is very rare.  -She had RAI ablation in October 2022.    Her previsit thyroid function tests are consistent with under-replacement.  She is advised to increase her Levothyroxine to 75 mcg po daily before breakfast.  Will repeat labs in 2 months and adjust dose further if needed.   - The correct intake of thyroid hormone (Levothyroxine, Synthroid), is on empty stomach first thing in the morning, with water, separated by at least 30 minutes from breakfast and other medications,  and separated by more than 4 hours from calcium, iron, multivitamins, acid reflux medications (PPIs).  - This medication is a life-long medication and will be needed to correct thyroid hormone imbalances for the rest of your life.  The dose may change from time to time, based on thyroid blood work.  - It is extremely important to be consistent taking this medication, near the same time each morning.  -AVOID TAKING PRODUCTS CONTAINING BIOTIN (commonly found in Hair, Skin, Nails vitamins) AS IT INTERFERES WITH THE VALIDITY OF THYROID FUNCTION BLOOD TESTS.     -Patient is advised to maintain close follow up with Zhou-Talbert, Ralene Bathe, MD for primary care needs.     I spent 20 minutes in the care of the patient today including review of labs from Thyroid Function, CMP, and other relevant labs ; imaging/biopsy records (current and previous including abstractions from other facilities); face-to-face time discussing  her lab results and symptoms, medications doses, her options of short and long term treatment based on the latest standards of care /  guidelines;   and documenting the encounter.  Mineola Nichter  participated in the discussions, expressed understanding, and voiced agreement with the above plans.  All questions were answered to her satisfaction. she is encouraged to contact clinic should she have any questions or concerns prior to her return visit.  Follow up plan: Return in about 2 months (around 01/23/2022) for Thyroid follow up, Previsit labs.   Thank you for involving me in the care of this pleasant patient, and I will continue to update you with her progress.    Ronny Bacon, [facility] [address] [place], [place] [address] Phone: [phone] Fax: [phone]  11/25/2021, 10:17 AM

## 2022-01-17 ENCOUNTER — Other Ambulatory Visit (HOSPITAL_COMMUNITY)
Admission: RE | Admit: 2022-01-17 | Discharge: 2022-01-17 | Disposition: A | Discharge status: Home / Self Care | Payer: Medicaid Other | Source: Ambulatory Visit | Attending: Nurse Practitioner | Admitting: Nurse Practitioner

## 2022-01-17 DIAGNOSIS — E890 Postprocedural hypothyroidism: Secondary | ICD-10-CM | POA: Diagnosis present

## 2022-01-17 LAB — T4, FREE: Free T4: 0.82 ng/dL (ref 0.61–1.12)

## 2022-01-17 LAB — TSH: TSH: 8.586 u[IU]/mL — ABNORMAL HIGH (ref 0.350–4.500)

## 2022-01-25 ENCOUNTER — Ambulatory Visit: Payer: Medicaid Other | Admitting: Nurse Practitioner

## 2022-01-27 ENCOUNTER — Encounter: Payer: Self-pay | Admitting: Nurse Practitioner

## 2022-01-27 ENCOUNTER — Ambulatory Visit: Payer: Medicaid Other | Admitting: Nurse Practitioner

## 2022-01-27 VITALS — BP 143/84 | HR 110 | Ht 62.25 in | Wt 137.8 lb

## 2022-01-27 DIAGNOSIS — E890 Postprocedural hypothyroidism: Secondary | ICD-10-CM

## 2022-01-27 MED ORDER — LEVOTHYROXINE SODIUM 88 MCG PO TABS: 88.0000 ug | ORAL_TABLET | Freq: Every day | ORAL | 1 refills | Status: DC

## 2022-01-27 NOTE — Patient Instructions (Signed)

## 2022-01-27 NOTE — Progress Notes (Signed)
? ? ? 01/27/2022   ? ? ?Endocrinology Follow Up Note  ? ? ?Subjective:  ? ? Patient ID: Monica Cisneros, female    DOB: May 31, 1961, PCP Monica Cisneros. ? ? ?Past Medical History:  ?Diagnosis Date  ? Diabetes mellitus, type II (HCC)   ? Hyperthyroidism   ? ? ?History reviewed. No pertinent surgical history. ? ?Social History  ? ?Socioeconomic History  ? Marital status: Married  ?  Spouse name: Not on file  ? Number of children: Not on file  ? Years of education: Not on file  ? Highest education level: Not on file  ?Occupational History  ? Not on file  ?Tobacco Use  ? Smoking status: Every Day  ?  Packs/day: 1.00  ?  Types: Cigarettes  ? Smokeless tobacco: Never  ?Vaping Use  ? Vaping Use: Never used  ?Substance and Sexual Activity  ? Alcohol use: Not Currently  ? Drug use: No  ? Sexual activity: Not on file  ?Other Topics Concern  ? Not on file  ?Social History Narrative  ? Not on file  ? ?Social Determinants of Health  ? ?Financial Resource Strain: Not on file  ?Food Insecurity: Not on file  ?Transportation Needs: Not on file  ?Physical Activity: Not on file  ?Stress: Not on file  ?Social Connections: Not on file  ? ? ?History reviewed. No pertinent family history. ? ?Outpatient Encounter Medications as of 01/27/2022  ?Medication Sig  ? Ascorbic Acid (VITAMIN C) 1000 MG tablet 1 tablet  ? aspirin 81 MG chewable tablet Chew 81 mg by mouth daily.  ? atorvastatin (LIPITOR) 40 MG tablet Take 40 mg by mouth daily.  ? cholecalciferol (VITAMIN D3) 25 MCG (1000 UNIT) tablet 1 tablet  ? cyclobenzaprine (FLEXERIL) 10 MG tablet Take 10 mg by mouth 3 (three) times daily.  ? escitalopram (LEXAPRO) 10 MG tablet Take 10 mg by mouth daily.  ? hydrOXYzine (ATARAX/VISTARIL) 25 MG tablet 1 tablet as needed  ? insulin detemir (LEVEMIR FLEXTOUCH) 100 UNIT/ML FlexPen Inject 15 Units into the skin at bedtime.  ? lisinopril (ZESTRIL) 10 MG tablet Take 10 mg by mouth daily.  ? metFORMIN (GLUCOPHAGE) 500 MG tablet Take 500 mg by  mouth 2 (two) times daily.  ? naproxen (NAPROSYN) 500 MG tablet Take 500 mg by mouth 2 (two) times daily. For neck  ? Omega-3 Fatty Acids (FISH OIL) 1000 MG CAPS 1 capsule  ? [DISCONTINUED] levothyroxine (SYNTHROID) 75 MCG tablet Take 1 tablet (75 mcg total) by mouth daily.  ? levothyroxine (SYNTHROID) 88 MCG tablet Take 1 tablet (88 mcg total) by mouth daily.  ? ?No facility-administered encounter medications on file as of 01/27/2022.  ? ? ?ALLERGIES: ?No Known Allergies ? ?VACCINATION STATUS: ? ?There is no immunization history on file for this patient. ? ? ?HPI ? ?Monica Cisneros is 61 y.o. female who presents today with a medical history as above. she is being seen in follow up after being seen in consultation for hyperthyroidism requested by Monica Cisneros.  she has been dealing with symptoms of irritability, unintentional weight loss, tremors, and insomnia for about 6 months.  ? ?she denies dysphagia, choking, shortness of breath, no recent voice change.  ?  ?she does have extensive family history of thyroid dysfunction in her daughters, siblings, and parents, but denies family hx of thyroid cancer. she denies personal history of goiter. she is not on any anti-thyroid medications nor on any thyroid hormone supplements. Denies use of  Biotin containing supplements.  she is willing to proceed with appropriate work up and therapy for thyrotoxicosis. ? ?Review of systems ? ?Constitutional: + Minimally fluctuating body weight,  current Body mass index is 25 kg/m?. , no fatigue, no subjective hyperthermia, no subjective hypothermia ?Eyes: no blurry vision, no xerophthalmia ?ENT: no sore throat, no nodules palpated in throat, no dysphagia/odynophagia, no hoarseness ?Cardiovascular: no chest pain, no shortness of breath, no palpitations, no leg swelling ?Respiratory: no cough, no shortness of breath ?Gastrointestinal: no nausea/vomiting/diarrhea ?Musculoskeletal: no muscle/joint aches ?Skin: no rashes, no  hyperemia ?Neurological: no tremors, no numbness, no tingling, no dizziness ?Psychiatric: no depression, no anxiety ? ? ?Objective:  ?  ?BP (!) 143/84   Pulse (!) 110   Ht 5' 2.25" (1.581 m)   Wt 137 lb 12.8 oz (62.5 kg)   LMP  (LMP Unknown)   SpO2 99%   BMI 25.00 kg/m?   ?Wt Readings from Last 3 Encounters:  ?01/27/22 137 lb 12.8 oz (62.5 kg)  ?11/25/21 136 lb (61.7 kg)  ?10/14/21 134 lb (60.8 kg)  ?  ? ?BP Readings from Last 3 Encounters:  ?01/27/22 (!) 143/84  ?11/25/21 (!) 171/84  ?10/14/21 (!) 174/89  ? ?                     ? ?Physical Exam- Limited ? ?Constitutional:  Body mass index is 25 kg/m?. , not in acute distress, normal state of mind ?Eyes:  EOMI, no exophthalmos ?Neck: Supple ?Cardiovascular: RRR, no murmurs, rubs, or gallops, no edema ?Respiratory: Adequate breathing efforts, no crackles, rales, rhonchi, or wheezing ?Musculoskeletal: no gross deformities, strength intact in all four extremities, no gross restriction of joint movements ?Skin:  no rashes, no hyperemia ?Neurological: no tremor with outstretched hands ? ? ?CMP  ?   ?Component Value Date/Time  ? NA 139 09/23/2014 2114  ? K 3.7 09/23/2014 2114  ? CL 101 09/23/2014 2114  ? CO2 22 09/23/2014 2114  ? GLUCOSE 168 (H) 09/23/2014 2114  ? BUN 10 09/23/2014 2114  ? CREATININE 0.79 09/23/2014 2114  ? CALCIUM 9.5 09/23/2014 2114  ? GFRNONAA >90 09/23/2014 2114  ? GFRAA >90 09/23/2014 2114  ? ? ? ?CBC ?   ?Component Value Date/Time  ? WBC 14.9 (H) 09/23/2014 2114  ? RBC 4.63 09/23/2014 2114  ? HGB 13.8 09/23/2014 2114  ? HCT 41.3 09/23/2014 2114  ? PLT 255 09/23/2014 2114  ? MCV 89.2 09/23/2014 2114  ? MCH 29.8 09/23/2014 2114  ? MCHC 33.4 09/23/2014 2114  ? RDW 13.1 09/23/2014 2114  ? LYMPHSABS 2.7 09/23/2014 2114  ? MONOABS 1.1 (H) 09/23/2014 2114  ? EOSABS 0.2 09/23/2014 2114  ? BASOSABS 0.0 09/23/2014 2114  ? ? ? ?Diabetic Labs (most recent): ?No results found for: HGBA1C ? ?Lipid Panel  ?No results found for: CHOL, TRIG, HDL, CHOLHDL,  VLDL, LDLCALC, LDLDIRECT, LABVLDL ? ? ?Lab Results  ?Component Value Date  ? TSH 8.586 (H) 01/17/2022  ? TSH 38.296 (H) 11/12/2021  ? TSH 0.245 (L) 09/28/2021  ? TSH <0.010 (L) 09/07/2021  ? TSH 0.01 (A) 05/26/2021  ? FREET4 0.82 01/17/2022  ? FREET4 0.57 (L) 11/12/2021  ? FREET4 0.38 (L) 09/28/2021  ? FREET4 0.91 09/07/2021  ?  ? ?Thyroid [place] from 06/08/2021 ?CLINICAL DATA:  Hyperthyroid. ?  ?EXAM: ?THYROID ULTRASOUND ?  ?TECHNIQUE: ?Ultrasound examination of the thyroid gland and adjacent soft ?tissues was performed. ?  ?COMPARISON:  None. ?  ?FINDINGS: ?Parenchymal Echotexture: Moderately  heterogenous ?  ?Isthmus: 0.2 cm ?  ?Right lobe: 4.6 x 2.0 x 1.4 cm ?  ?Left lobe: 4.2 x 1.7 x 1.4 cm ?  ?_________________________________________________________ ?  ?Estimated total number of nodules >/= 1 cm: 1 ?  ?Number of spongiform nodules >/=  2 cm not described below (TR1): 0 ?  ?Number of mixed cystic and solid nodules >/= 1.5 cm not described ?below (TR2): 0 ?  ?_________________________________________________________ ?  ?Nodule # 2: ?  ?Location: Left; Superior ?  ?Maximum size: 1.7 cm; Other 2 dimensions: 1.4 x 1.1 cm ?  ?Composition: solid/almost completely solid (2) ?  ?Echogenicity: hypoechoic (2) ?  ?Shape: not taller-than-wide (0) ?  ?Margins: smooth (0) ?  ?Echogenic foci: none (0) ?  ?ACR TI-RADS total points: 4. ?  ?ACR TI-RADS risk category: TR4 (4-6 points). ?  ?ACR TI-RADS recommendations: ?  ?**Given size (>/= 1.5 cm) and appearance, fine needle aspiration of ?this moderately suspicious nodule should be considered based on ?TI-RADS criteria. ?  ?_________________________________________________________ ?  ?Additional small subcentimeter thyroid nodules bilaterally which do ?not meet criteria to warrant further evaluation. ?  ?IMPRESSION: ?Approximately 1.7 cm TI-RADS category 4 nodule in the left superior ?gland meets criteria to consider fine-needle aspiration biopsy. ?  ?The background thyroid gland is  moderately heterogeneous in ?appearance. ?  ?The above is in keeping with the ACR TI-RADS recommendations - J Am ?Coll Radiol 2017;14:587-595. ?  ?  ?Electronically Signed ?  By: Malachy Moan M.D. ?  On: 08/

## 2022-04-15 ENCOUNTER — Other Ambulatory Visit (HOSPITAL_COMMUNITY)
Admission: RE | Admit: 2022-04-15 | Discharge: 2022-04-15 | Disposition: A | Discharge status: Home / Self Care | Payer: Medicaid Other | Source: Ambulatory Visit | Attending: Nurse Practitioner | Admitting: Nurse Practitioner

## 2022-04-15 DIAGNOSIS — E890 Postprocedural hypothyroidism: Secondary | ICD-10-CM | POA: Diagnosis present

## 2022-04-15 LAB — T4, FREE: Free T4: 0.94 ng/dL (ref 0.61–1.12)

## 2022-04-15 LAB — TSH: TSH: 1.511 u[IU]/mL (ref 0.350–4.500)

## 2022-04-29 ENCOUNTER — Encounter: Payer: Self-pay | Admitting: Nurse Practitioner

## 2022-04-29 ENCOUNTER — Ambulatory Visit: Payer: Medicaid Other | Admitting: Nurse Practitioner

## 2022-04-29 VITALS — BP 114/70 | HR 69 | Ht 62.25 in | Wt 139.0 lb

## 2022-04-29 DIAGNOSIS — E890 Postprocedural hypothyroidism: Secondary | ICD-10-CM | POA: Diagnosis not present

## 2022-04-29 MED ORDER — LEVOTHYROXINE SODIUM 100 MCG PO TABS: 100.0000 ug | ORAL_TABLET | Freq: Every day | ORAL | 3 refills | Status: DC

## 2022-04-29 NOTE — Patient Instructions (Signed)

## 2022-04-29 NOTE — Progress Notes (Signed)
04/29/2022     Endocrinology Follow Up Note    Subjective:    Patient ID: Monica Cisneros, female    DOB: Feb 24, 1961, PCP Zhou-Talbert, Ralene Bathe, MD.   Past Medical History:  Diagnosis Date   Diabetes mellitus, type II ([facility])    Hyperthyroidism     History reviewed. No pertinent surgical history.  Social History   Socioeconomic History   Marital status: Married    Spouse name: Not on file   Number of children: Not on file   Years of education: Not on file   Highest education level: Not on file  Occupational History   Not on file  Tobacco Use   Smoking status: Every Day    Packs/day: 1.00    Types: Cigarettes   Smokeless tobacco: Never  Vaping Use   Vaping Use: Never used  Substance and Sexual Activity   Alcohol use: Not Currently   Drug use: No   Sexual activity: Not on file  Other Topics Concern   Not on file  Social History Narrative   Not on file   Social Determinants of Health   Financial Resource Strain: Not on file  Food Insecurity: Not on file  Transportation Needs: Not on file  Physical Activity: Not on file  Stress: Not on file  Social Connections: Not on file    History reviewed. No pertinent family history.  Outpatient Encounter Medications as of 04/29/2022  Medication Sig   Ascorbic Acid (VITAMIN C) 1000 MG tablet 1 tablet   aspirin 81 MG chewable tablet Chew 81 mg by mouth daily.   atorvastatin (LIPITOR) 40 MG tablet Take 40 mg by mouth daily.   cholecalciferol (VITAMIN D3) 25 MCG (1000 UNIT) tablet 1 tablet   cyclobenzaprine (FLEXERIL) 10 MG tablet Take 10 mg by mouth 3 (three) times daily.   escitalopram (LEXAPRO) 10 MG tablet Take 10 mg by mouth daily.   hydrOXYzine (ATARAX/VISTARIL) 25 MG tablet 1 tablet as needed   insulin detemir (LEVEMIR FLEXTOUCH) 100 UNIT/ML FlexPen Inject 15 Units into the skin at bedtime.   levothyroxine (SYNTHROID) 100 MCG tablet Take 1 tablet (100 mcg total) by mouth daily before breakfast.    lisinopril (ZESTRIL) 10 MG tablet Take 10 mg by mouth daily.   metFORMIN (GLUCOPHAGE) 500 MG tablet Take 500 mg by mouth 2 (two) times daily.   naproxen (NAPROSYN) 500 MG tablet Take 500 mg by mouth 2 (two) times daily. For neck   Omega-3 Fatty Acids (FISH OIL) 1000 MG CAPS 1 capsule   [DISCONTINUED] levothyroxine (SYNTHROID) 88 MCG tablet Take 1 tablet (88 mcg total) by mouth daily.   No facility-administered encounter medications on file as of 04/29/2022.    ALLERGIES: No Known Allergies  VACCINATION STATUS:  There is no immunization history on file for this patient.   HPI  Monica Cisneros is 61 y.o. female who presents today with a medical history as above. she is being seen in follow up after being seen in consultation for hyperthyroidism requested by Zhou-Talbert, Ralene Bathe, MD.  she has been dealing with symptoms of irritability, unintentional weight loss, tremors, and insomnia for about 6 months.   she denies dysphagia, choking, shortness of breath, no recent voice change.    she does have extensive family history of thyroid dysfunction in her daughters, siblings, and parents, but denies family hx of thyroid cancer. she denies personal history of goiter. she is not on any anti-thyroid medications nor on any thyroid hormone supplements. Denies  use of Biotin containing supplements.   She had RAI ablation for hyperthyroidism in October 2022, now on thyroid hormone replacement.  Review of systems  Constitutional: + steadily increasing body weight,  current Body mass index is 25.22 kg/m. , no fatigue, no subjective hyperthermia, no subjective hypothermia Eyes: no blurry vision, no xerophthalmia ENT: no sore throat, no nodules palpated in throat, no dysphagia/odynophagia, no hoarseness Cardiovascular: no chest pain, no shortness of breath, no palpitations, no leg swelling Respiratory: no cough, no shortness of breath Gastrointestinal: no nausea/vomiting/diarrhea Musculoskeletal: no  muscle/joint aches Skin: no rashes, no hyperemia Neurological: no tremors, no numbness, no tingling, no dizziness Psychiatric: no depression, no anxiety   Objective:    BP 114/70   Pulse 69   Ht 5' 2.25" (1.581 m)   Wt 139 lb (63 kg)   LMP  (LMP Unknown)   BMI 25.22 kg/m   Wt Readings from Last 3 Encounters:  04/29/22 139 lb (63 kg)  01/27/22 137 lb 12.8 oz (62.5 kg)  11/25/21 136 lb (61.7 kg)     BP Readings from Last 3 Encounters:  04/29/22 114/70  01/27/22 (!) 143/84  11/25/21 (!) 171/84                         Physical Exam- Limited  Constitutional:  Body mass index is 25.22 kg/m. , not in acute distress, normal state of mind Eyes:  EOMI, no exophthalmos Neck: Supple Cardiovascular: RRR, no murmurs, rubs, or gallops, no edema Respiratory: Adequate breathing efforts, no crackles, rales, rhonchi, or wheezing Musculoskeletal: no gross deformities, strength intact in all four extremities, no gross restriction of joint movements Skin:  no rashes, no hyperemia Neurological: no tremor with outstretched hands   CMP     Component Value Date/Time   NA 139 09/23/2014 2114   K 3.7 09/23/2014 2114   CL 101 09/23/2014 2114   CO2 22 09/23/2014 2114   GLUCOSE 168 (H) 09/23/2014 2114   BUN 10 09/23/2014 2114   CREATININE 0.79 09/23/2014 2114   CALCIUM 9.5 09/23/2014 2114   GFRNONAA >90 09/23/2014 2114   GFRAA >90 09/23/2014 2114     CBC    Component Value Date/Time   WBC 14.9 (H) 09/23/2014 2114   RBC 4.63 09/23/2014 2114   HGB 13.8 09/23/2014 2114   HCT 41.3 09/23/2014 2114   PLT 255 09/23/2014 2114   MCV 89.2 09/23/2014 2114   MCH 29.8 09/23/2014 2114   MCHC 33.4 09/23/2014 2114   RDW 13.1 09/23/2014 2114   LYMPHSABS 2.7 09/23/2014 2114   MONOABS 1.1 (H) 09/23/2014 2114   EOSABS 0.2 09/23/2014 2114   BASOSABS 0.0 09/23/2014 2114     Diabetic Labs (most recent): No results found for: "HGBA1C", "MICROALBUR"  Lipid Panel  No results found for: "CHOL",  "TRIG", "HDL", "CHOLHDL", "VLDL", "LDLCALC", "LDLDIRECT", "LABVLDL"   Lab Results  Component Value Date   TSH 1.511 04/15/2022   TSH 8.586 (H) 01/17/2022   TSH 38.296 (H) 11/12/2021   TSH 0.245 (L) 09/28/2021   TSH <0.010 (L) 09/07/2021   TSH 0.01 (A) 05/26/2021   FREET4 0.94 04/15/2022   FREET4 0.82 01/17/2022   FREET4 0.57 (L) 11/12/2021   FREET4 0.38 (L) 09/28/2021   FREET4 0.91 09/07/2021     Thyroid US from 06/08/2021 CLINICAL DATA:  Hyperthyroid.   EXAM: THYROID ULTRASOUND   TECHNIQUE: Ultrasound examination of the thyroid gland and adjacent soft tissues was performed.   COMPARISON:  None.  FINDINGS: Parenchymal Echotexture: Moderately heterogenous   Isthmus: 0.2 cm   Right lobe: 4.6 x 2.0 x 1.4 cm   Left lobe: 4.2 x 1.7 x 1.4 cm   _________________________________________________________   Estimated total number of nodules >/= 1 cm: 1   Number of spongiform nodules >/=  2 cm not described below (TR1): 0   Number of mixed cystic and solid nodules >/= 1.5 cm not described below (TR2): 0   _________________________________________________________   Nodule # 2:   Location: Left; Superior   Maximum size: 1.7 cm; Other 2 dimensions: 1.4 x 1.1 cm   Composition: solid/almost completely solid (2)   Echogenicity: hypoechoic (2)   Shape: not taller-than-wide (0)   Margins: smooth (0)   Echogenic foci: none (0)   ACR TI-RADS total points: 4.   ACR TI-RADS risk category: TR4 (4-6 points).   ACR TI-RADS recommendations:   **Given size (>/= 1.5 cm) and appearance, fine needle aspiration of this moderately suspicious nodule should be considered based on TI-RADS criteria.   _________________________________________________________   Additional small subcentimeter thyroid nodules bilaterally which do not meet criteria to warrant further evaluation.   IMPRESSION: Approximately 1.7 cm TI-RADS category 4 nodule in the left superior gland meets  criteria to consider fine-needle aspiration biopsy.   The background thyroid gland is moderately heterogeneous in appearance.   The above is in keeping with the ACR TI-RADS recommendations - J Am Coll Radiol 2017;14:587-595.     Electronically Signed   By: Malachy Moan M.D.   On: 06/08/2021 11:46 -----------------------------------------------------------------------------------------------------------------------------  Uptake and Scan from 07/14/21 CLINICAL DATA:  Hyperthyroidism, suppressed TSH 0.01, family history of Graves disease, weight loss for 6 months   EXAM: THYROID SCAN AND UPTAKE - 4 AND 24 HOURS   TECHNIQUE: Following oral administration of I-123 capsule, anterior planar imaging was acquired at 24 hours. Thyroid uptake was calculated with a thyroid probe at 4-6 hours and 24 hours.   RADIOPHARMACEUTICALS:  434 uCi I-123 sodium iodide p.o.   COMPARISON:  None   Correlation: Thyroid ultrasound 06/08/2021   FINDINGS: Homogeneous tracer distribution throughout both thyroid lobes.   No focal areas of increased or decreased tracer localization seen.   4 hour I-123 uptake = 26.2% (normal 5-20%)   24 hour I-123 uptake = 51.0% (normal 10-30%)   IMPRESSION: Normal thyroid scan with elevated 4 hour and 24 hour radio iodine uptakes consistent with Graves disease.     Electronically Signed   By: Ulyses Southward M.D.   On: 07/14/2021 17:12    Latest Reference Range & Units 09/28/21 10:26 11/12/21 12:38 01/17/22 10:15 04/15/22 12:21 04/15/22 12:22  TSH 0.350 - 4.500 uIU/mL 0.245 (L) 38.296 (H) 8.586 (H) 1.511   T4,Free(Direct) 0.61 - 1.12 ng/dL 7.20 (L) 9.47 (L) 0.96  0.94  (L): Data is abnormally low (H): Data is abnormally high   Assessment & Plan:   1. 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  -Her uptake and scan shows 4-hr uptake of 26.2%, and 24-hr uptake of  51%, confirming suspicion of Graves disease.    -She will not need to proceed with biopsy of suspicious nodule as the likelihood of thyroid cancer AND Graves disease is very rare.  -She had RAI ablation in October 2022.    Her previsit thyroid function tests are consistent with slight under-replacement.  She is advised to increase her Levothyroxine to 100 mcg po  daily before breakfast.  Will repeat labs in 3 months and adjust dose further if needed.   - The correct intake of thyroid hormone (Levothyroxine, Synthroid), is on empty stomach first thing in the morning, with water, separated by at least 30 minutes from breakfast and other medications,  and separated by more than 4 hours from calcium, iron, multivitamins, acid reflux medications (PPIs).  - This medication is a life-long medication and will be needed to correct thyroid hormone imbalances for the rest of your life.  The dose may change from time to time, based on thyroid blood work.  - It is extremely important to be consistent taking this medication, near the same time each morning.  -AVOID TAKING PRODUCTS CONTAINING BIOTIN (commonly found in Hair, Skin, Nails vitamins) AS IT INTERFERES WITH THE VALIDITY OF THYROID FUNCTION BLOOD TESTS.     -Patient is advised to maintain close follow up with Zhou-Talbert, Ralene Bathe, MD for primary care needs.     I spent 15 minutes in the care of the patient today including review of labs from Thyroid Function, CMP, and other relevant labs ; imaging/biopsy records (current and previous including abstractions from other facilities); face-to-face time discussing  her lab results and symptoms, medications doses, her options of short and long term treatment based on the latest standards of care / guidelines;   and documenting the encounter.  Kaity Asch  participated in the discussions, expressed understanding, and voiced agreement with the above plans.  All questions were answered to her  satisfaction. she is encouraged to contact clinic should she have any questions or concerns prior to her return visit.  Follow up plan: Return in about 3 months (around 07/30/2022) for Thyroid follow up, Previsit labs.   Thank you for involving me in the care of this pleasant patient, and I will continue to update you with her progress.   Ronny Bacon, [facility] [address] [place], [place] [address] Phone: [phone] Fax: [phone]  04/29/2022, 9:28 AM

## 2022-07-25 ENCOUNTER — Other Ambulatory Visit (HOSPITAL_COMMUNITY)
Admission: RE | Admit: 2022-07-25 | Discharge: 2022-07-25 | Disposition: A | Discharge status: Home / Self Care | Payer: Medicaid Other | Source: Ambulatory Visit | Attending: Nurse Practitioner | Admitting: Nurse Practitioner

## 2022-07-25 DIAGNOSIS — E890 Postprocedural hypothyroidism: Secondary | ICD-10-CM | POA: Diagnosis present

## 2022-07-25 LAB — TSH: TSH: <0.01 u[IU]/mL — ABNORMAL LOW (ref 0.350–4.500)

## 2022-07-25 LAB — T4, FREE: Free T4: 1.35 ng/dL — ABNORMAL HIGH (ref 0.61–1.12)

## 2022-08-01 ENCOUNTER — Encounter: Payer: Self-pay | Admitting: Nurse Practitioner

## 2022-08-01 ENCOUNTER — Ambulatory Visit: Payer: Medicaid Other | Admitting: Nurse Practitioner

## 2022-08-01 VITALS — BP 121/67 | HR 91 | Ht 62.25 in | Wt 129.8 lb

## 2022-08-01 DIAGNOSIS — E890 Postprocedural hypothyroidism: Secondary | ICD-10-CM

## 2022-08-01 NOTE — Patient Instructions (Signed)

## 2022-08-01 NOTE — Progress Notes (Signed)
08/01/2022     Endocrinology Follow Up Note    Subjective:    Patient ID: Monica Cisneros, female    DOB: 1961-09-03, PCP Zhou-Talbert, Elwyn Lade, MD.   Past Medical History:  Diagnosis Date   Diabetes mellitus, type II ([facility])    Hyperthyroidism     History reviewed. No pertinent surgical history.  Social History   Socioeconomic History   Marital status: Married    Spouse name: Not on file   Number of children: Not on file   Years of education: Not on file   Highest education level: Not on file  Occupational History   Not on file  Tobacco Use   Smoking status: Every Day    Packs/day: 1.00    Types: Cigarettes   Smokeless tobacco: Never  Vaping Use   Vaping Use: Never used  Substance and Sexual Activity   Alcohol use: Not Currently   Drug use: No   Sexual activity: Not on file  Other Topics Concern   Not on file  Social History Narrative   Not on file   Social Determinants of Health   Financial Resource Strain: Not on file  Food Insecurity: Not on file  Transportation Needs: Not on file  Physical Activity: Not on file  Stress: Not on file  Social Connections: Not on file    History reviewed. No pertinent family history.  Outpatient Encounter Medications as of 08/01/2022  Medication Sig   Ascorbic Acid (VITAMIN C) 1000 MG tablet 1 tablet   aspirin 81 MG chewable tablet Chew 81 mg by mouth daily.   atorvastatin (LIPITOR) 40 MG tablet Take 40 mg by mouth daily.   cholecalciferol (VITAMIN D3) 25 MCG (1000 UNIT) tablet 1 tablet   cyclobenzaprine (FLEXERIL) 10 MG tablet Take 10 mg by mouth 3 (three) times daily.   escitalopram (LEXAPRO) 10 MG tablet Take 10 mg by mouth daily.   hydrOXYzine (ATARAX/VISTARIL) 25 MG tablet 1 tablet as needed   insulin detemir (LEVEMIR FLEXTOUCH) 100 UNIT/ML FlexPen Inject 15 Units into the skin at bedtime.   levothyroxine (SYNTHROID) 100 MCG tablet Take 1 tablet (100 mcg total) by mouth daily before breakfast.    lisinopril (ZESTRIL) 10 MG tablet Take 10 mg by mouth daily.   metFORMIN (GLUCOPHAGE) 500 MG tablet Take 500 mg by mouth 2 (two) times daily.   naproxen (NAPROSYN) 500 MG tablet Take 500 mg by mouth 2 (two) times daily. For neck   Omega-3 Fatty Acids (FISH OIL) 1000 MG CAPS 1 capsule   No facility-administered encounter medications on file as of 08/01/2022.    ALLERGIES: No Known Allergies  VACCINATION STATUS:  There is no immunization history on file for this patient.   HPI  Monica Cisneros is 61 y.o. female who presents today with a medical history as above. she is being seen in follow up after being seen in consultation for hyperthyroidism requested by Zhou-Talbert, Elwyn Lade, MD.  she has been dealing with symptoms of irritability, unintentional weight loss, tremors, and insomnia for about 6 months.   she denies dysphagia, choking, shortness of breath, no recent voice change.    she does have extensive family history of thyroid dysfunction in her daughters, siblings, and parents, but denies family hx of thyroid cancer. she denies personal history of goiter. she is not on any anti-thyroid medications nor on any thyroid hormone supplements. Denies use of Biotin containing supplements.   She had RAI ablation for hyperthyroidism in October 2022, now  on thyroid hormone replacement.  Review of systems  Constitutional: + decreasing body weight,  current Body mass index is 23.55 kg/m. , no fatigue, no subjective hyperthermia, no subjective hypothermia Eyes: no blurry vision, no xerophthalmia ENT: no sore throat, no nodules palpated in throat, no dysphagia/odynophagia, no hoarseness Cardiovascular: no chest pain, no shortness of breath, no palpitations, no leg swelling Respiratory: no cough, no shortness of breath Gastrointestinal: no nausea/vomiting/diarrhea Musculoskeletal: no muscle/joint aches Skin: no rashes, no hyperemia Neurological: no tremors, no numbness, no tingling, no  dizziness Psychiatric: no depression, no anxiety   Objective:    BP 121/67 (BP Location: Right Arm, Patient Position: Sitting, Cuff Size: Normal)   Pulse 91   Ht 5' 2.25" (1.581 m)   Wt 129 lb 12.8 oz (58.9 kg)   LMP  (LMP Unknown)   BMI 23.55 kg/m   Wt Readings from Last 3 Encounters:  08/01/22 129 lb 12.8 oz (58.9 kg)  04/29/22 139 lb (63 kg)  01/27/22 137 lb 12.8 oz (62.5 kg)     BP Readings from Last 3 Encounters:  08/01/22 121/67  04/29/22 114/70  01/27/22 (!) 143/84                         Physical Exam- Limited  Constitutional:  Body mass index is 23.55 kg/m. , not in acute distress, normal state of mind Eyes:  EOMI, no exophthalmos Neck: Supple Cardiovascular: RRR, no murmurs, rubs, or gallops, no edema Respiratory: Adequate breathing efforts, no crackles, rales, rhonchi, or wheezing Musculoskeletal: no gross deformities, strength intact in all four extremities, no gross restriction of joint movements Skin:  no rashes, no hyperemia Neurological: no tremor with outstretched hands   CMP     Component Value Date/Time   NA 139 09/23/2014 2114   K 3.7 09/23/2014 2114   CL 101 09/23/2014 2114   CO2 22 09/23/2014 2114   GLUCOSE 168 (H) 09/23/2014 2114   BUN 10 09/23/2014 2114   CREATININE 0.79 09/23/2014 2114   CALCIUM 9.5 09/23/2014 2114   GFRNONAA >90 09/23/2014 2114   GFRAA >90 09/23/2014 2114     CBC    Component Value Date/Time   WBC 14.9 (H) 09/23/2014 2114   RBC 4.63 09/23/2014 2114   HGB 13.8 09/23/2014 2114   HCT 41.3 09/23/2014 2114   PLT 255 09/23/2014 2114   MCV 89.2 09/23/2014 2114   MCH 29.8 09/23/2014 2114   MCHC 33.4 09/23/2014 2114   RDW 13.1 09/23/2014 2114   LYMPHSABS 2.7 09/23/2014 2114   MONOABS 1.1 (H) 09/23/2014 2114   EOSABS 0.2 09/23/2014 2114   BASOSABS 0.0 09/23/2014 2114     Diabetic Labs (most recent): No results found for: "HGBA1C", "MICROALBUR"  Lipid Panel  No results found for: "CHOL", "TRIG", "HDL",  "CHOLHDL", "VLDL", "LDLCALC", "LDLDIRECT", "LABVLDL"   Lab Results  Component Value Date   TSH <0.010 (L) 07/25/2022   TSH 1.511 04/15/2022   TSH 8.586 (H) 01/17/2022   TSH 38.296 (H) 11/12/2021   TSH 0.245 (L) 09/28/2021   TSH <0.010 (L) 09/07/2021   TSH 0.01 (A) 05/26/2021   FREET4 1.35 (H) 07/25/2022   FREET4 0.94 04/15/2022   FREET4 0.82 01/17/2022   FREET4 0.57 (L) 11/12/2021   FREET4 0.38 (L) 09/28/2021   FREET4 0.91 09/07/2021     Thyroid US from 06/08/2021 CLINICAL DATA:  Hyperthyroid.   EXAM: THYROID ULTRASOUND   TECHNIQUE: Ultrasound examination of the thyroid gland and adjacent soft tissues  was performed.   COMPARISON:  None.   FINDINGS: Parenchymal Echotexture: Moderately heterogenous   Isthmus: 0.2 cm   Right lobe: 4.6 x 2.0 x 1.4 cm   Left lobe: 4.2 x 1.7 x 1.4 cm   _________________________________________________________   Estimated total number of nodules >/= 1 cm: 1   Number of spongiform nodules >/=  2 cm not described below (TR1): 0   Number of mixed cystic and solid nodules >/= 1.5 cm not described below (TR2): 0   _________________________________________________________   Nodule # 2:   Location: Left; Superior   Maximum size: 1.7 cm; Other 2 dimensions: 1.4 x 1.1 cm   Composition: solid/almost completely solid (2)   Echogenicity: hypoechoic (2)   Shape: not taller-than-wide (0)   Margins: smooth (0)   Echogenic foci: none (0)   ACR TI-RADS total points: 4.   ACR TI-RADS risk category: TR4 (4-6 points).   ACR TI-RADS recommendations:   **Given size (>/= 1.5 cm) and appearance, fine needle aspiration of this moderately suspicious nodule should be considered based on TI-RADS criteria.   _________________________________________________________   Additional small subcentimeter thyroid nodules bilaterally which do not meet criteria to warrant further evaluation.   IMPRESSION: Approximately 1.7 cm TI-RADS category 4  nodule in the left superior gland meets criteria to consider fine-needle aspiration biopsy.   The background thyroid gland is moderately heterogeneous in appearance.   The above is in keeping with the ACR TI-RADS recommendations - J Am Coll Radiol 2017;14:587-595.     Electronically Signed   By: Malachy Moan M.D.   On: 06/08/2021 11:46 -----------------------------------------------------------------------------------------------------------------------------  Uptake and Scan from 07/14/21 CLINICAL DATA:  Hyperthyroidism, suppressed TSH 0.01, family history of Graves disease, weight loss for 6 months   EXAM: THYROID SCAN AND UPTAKE - 4 AND 24 HOURS   TECHNIQUE: Following oral administration of I-123 capsule, anterior planar imaging was acquired at 24 hours. Thyroid uptake was calculated with a thyroid probe at 4-6 hours and 24 hours.   RADIOPHARMACEUTICALS:  434 uCi I-123 sodium iodide p.o.   COMPARISON:  None   Correlation: Thyroid ultrasound 06/08/2021   FINDINGS: Homogeneous tracer distribution throughout both thyroid lobes.   No focal areas of increased or decreased tracer localization seen.   4 hour I-123 uptake = 26.2% (normal 5-20%)   24 hour I-123 uptake = 51.0% (normal 10-30%)   IMPRESSION: Normal thyroid scan with elevated 4 hour and 24 hour radio iodine uptakes consistent with Graves disease.     Electronically Signed   By: Ulyses Southward M.D.   On: 07/14/2021 17:12    Latest Reference Range & Units 11/12/21 12:38 01/17/22 10:15 04/15/22 12:21 04/15/22 12:22 07/25/22 11:39  TSH 0.350 - 4.500 uIU/mL 38.296 (H) 8.586 (H) 1.511  <0.010 (L)  T4,Free(Direct) 0.61 - 1.12 ng/dL 8.67 (L) 6.19  5.09 3.26 (H)  (H): Data is abnormally high (L): Data is abnormally low   Assessment & Plan:   1. 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  -Her uptake and scan shows  4-hr uptake of 26.2%, and 24-hr uptake of 51%, confirming suspicion of Graves disease.    -She will not need to proceed with biopsy of suspicious nodule as the likelihood of thyroid cancer AND Graves disease is very rare.  -She had RAI ablation in October 2022.    Her previsit thyroid function tests are consistent with slight over-replacement.  She is  advised to continue her Levothyroxine 100 mcg dose but to skip 1 day of the week.   - The correct intake of thyroid hormone (Levothyroxine, Synthroid), is on empty stomach first thing in the morning, with water, separated by at least 30 minutes from breakfast and other medications,  and separated by more than 4 hours from calcium, iron, multivitamins, acid reflux medications (PPIs).  - This medication is a life-long medication and will be needed to correct thyroid hormone imbalances for the rest of your life.  The dose may change from time to time, based on thyroid blood work.  - It is extremely important to be consistent taking this medication, near the same time each morning.  -AVOID TAKING PRODUCTS CONTAINING BIOTIN (commonly found in Hair, Skin, Nails vitamins) AS IT INTERFERES WITH THE VALIDITY OF THYROID FUNCTION BLOOD TESTS.     -Patient is advised to maintain close follow up with Zhou-Talbert, Ralene Bathe, MD for primary care needs.     I spent 15 minutes in the care of the patient today including review of labs from Thyroid Function, CMP, and other relevant labs ; imaging/biopsy records (current and previous including abstractions from other facilities); face-to-face time discussing  her lab results and symptoms, medications doses, her options of short and long term treatment based on the latest standards of care / guidelines;   and documenting the encounter.  Nico Tegethoff  participated in the discussions, expressed understanding, and voiced agreement with the above plans.  All questions were answered to her satisfaction. she is  encouraged to contact clinic should she have any questions or concerns prior to her return visit.  Follow up plan: Return in about 3 months (around 11/01/2022) for Thyroid follow up, Previsit labs.   Thank you for involving me in the care of this pleasant patient, and I will continue to update you with her progress.   Ronny Bacon, [facility] [address] [place], [place] [address] Phone: [phone] Fax: [phone]  08/01/2022, 8:59 AM

## 2022-08-21 IMAGING — US US THYROID
1 series · 13 of 25 positions shown · non-contrast
Comparison: None.

CLINICAL DATA: Hyperthyroid.

EXAM:
THYROID ULTRASOUND
TECHNIQUE: Ultrasound examination of the thyroid gland and adjacent soft
tissues was performed.

[Series 1: us thyroid · 13 of 65 slices shown]
[im 1/65]
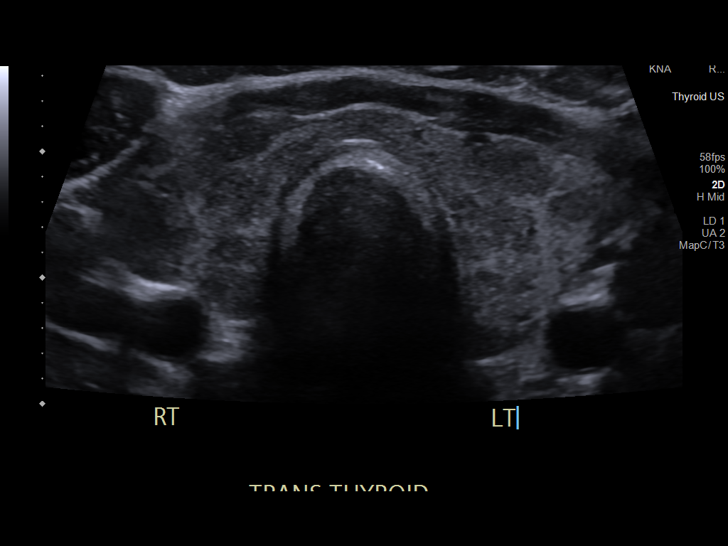
[im 6/65]
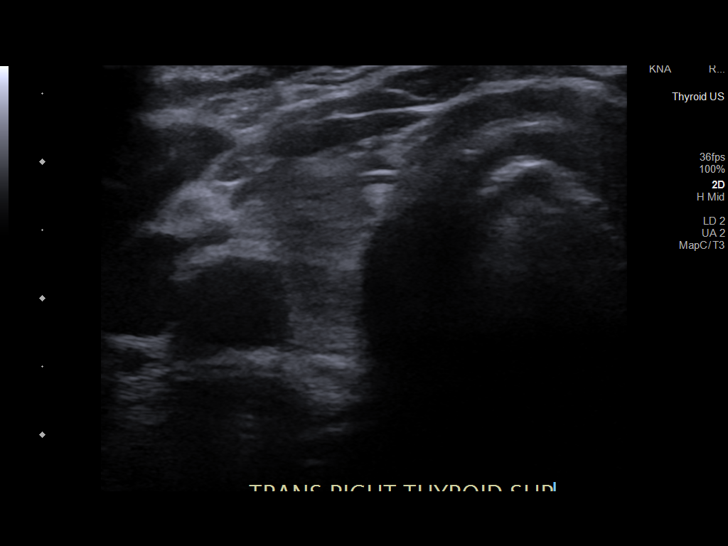
[im 11/65]
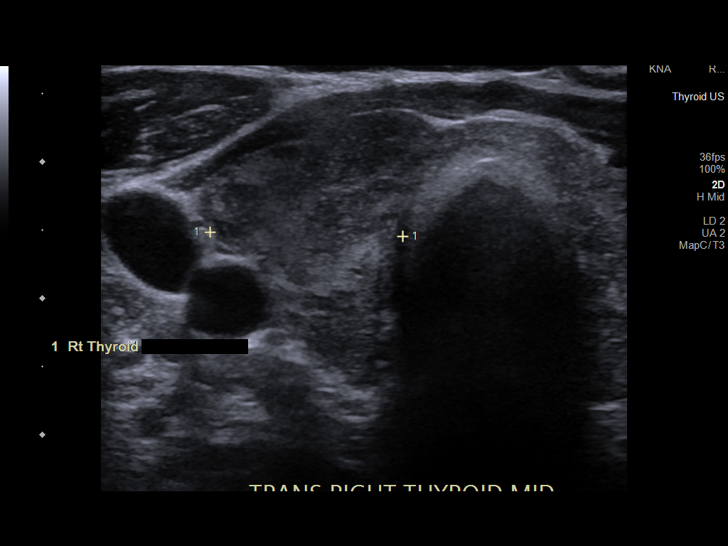
[im 17/65]
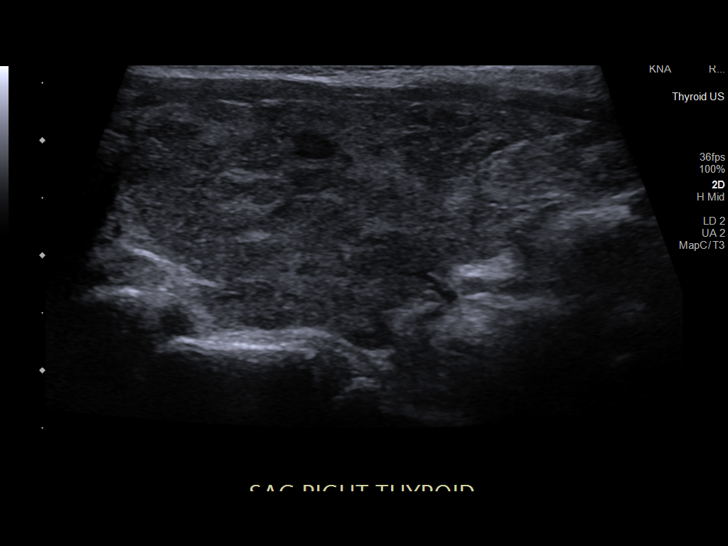
[im 22/65]
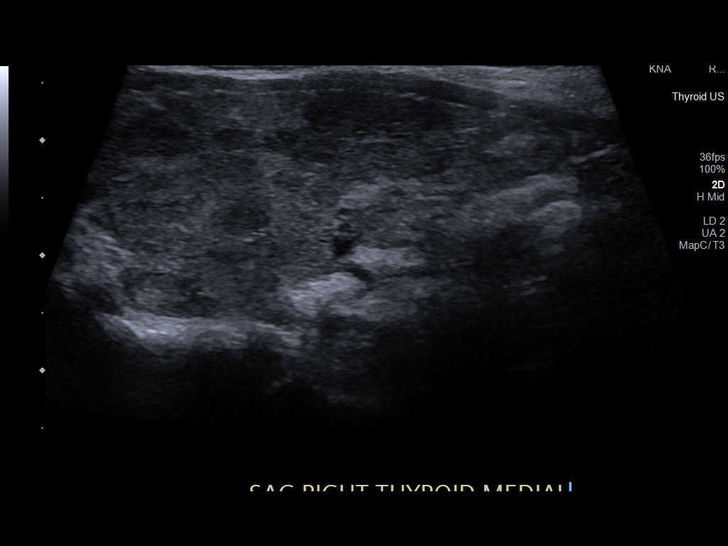
[im 27/65]
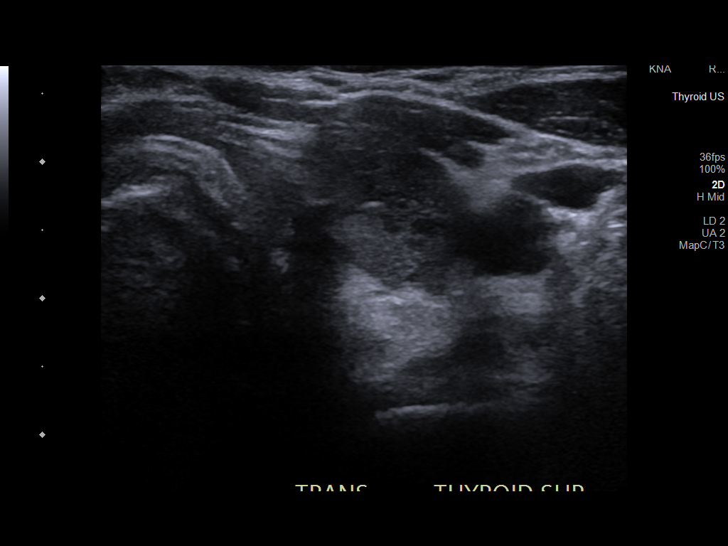
[im 33/65]
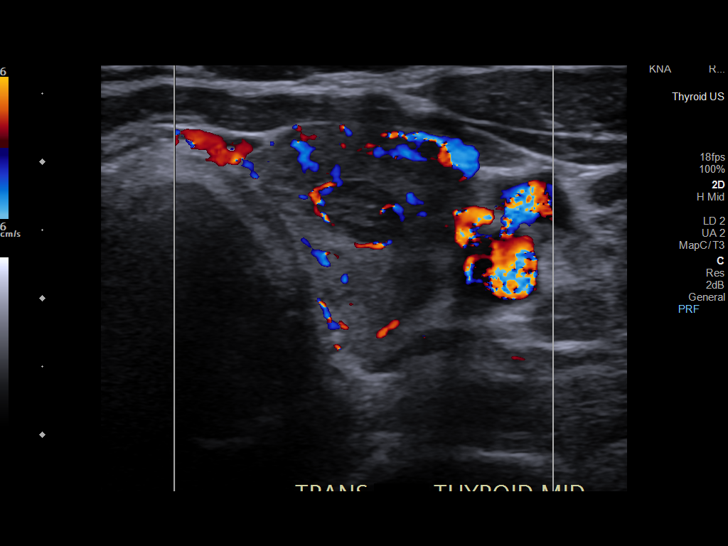
[im 38/65]
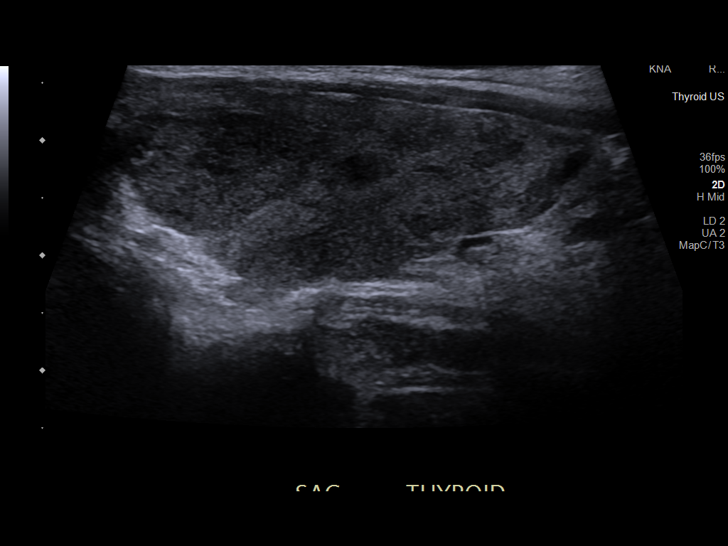
[im 43/65]
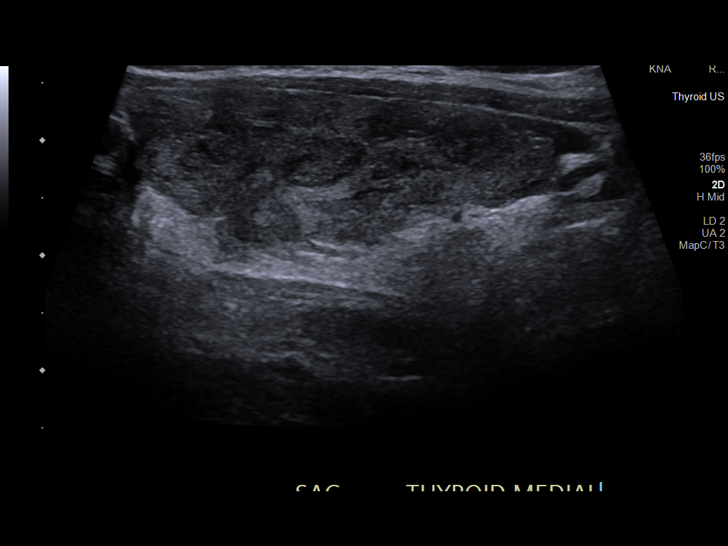
[im 49/65]
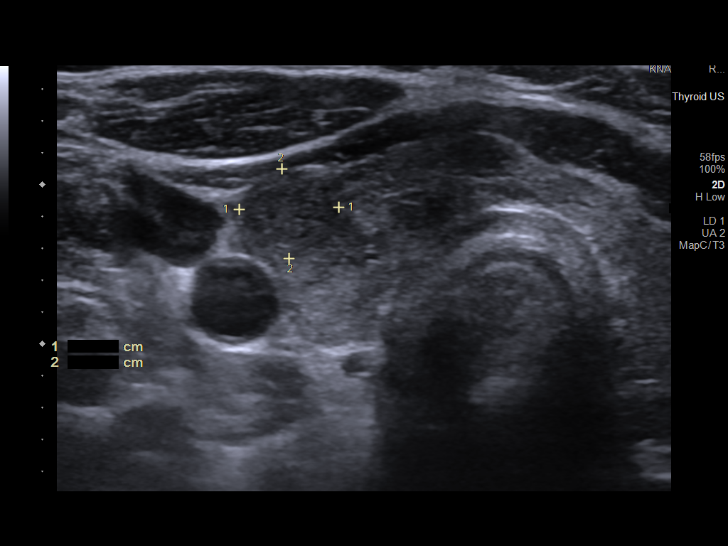
[im 54/65]
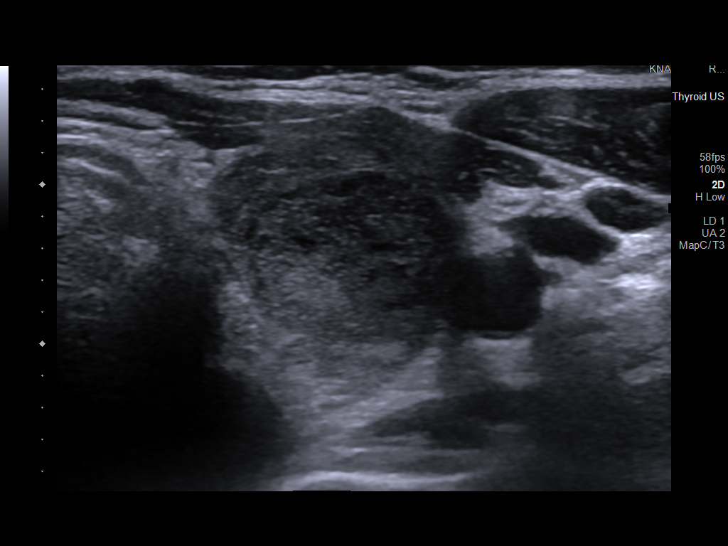
[im 59/65]
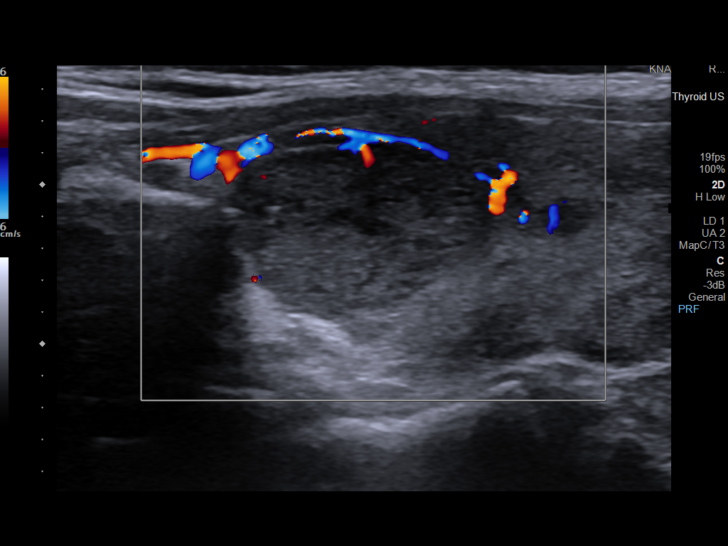
[im 65/65]
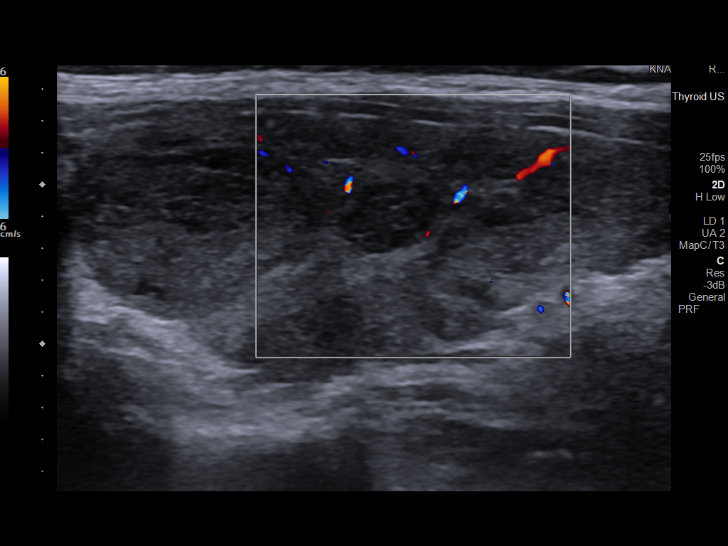

[13 of 25 positions shown; findings below may reference images not displayed]

FINDINGS: Parenchymal Echotexture: Moderately heterogenous

Isthmus: 0.2 cm

Right lobe: 4.6 x 2.0 x 1.4 cm

Left lobe: 4.2 x 1.7 x 1.4 cm

_________________________________________________________

Estimated total number of nodules >/= 1 cm: 1

Number of spongiform nodules >/=  2 cm not described below (TR1): 0

Number of mixed cystic and solid nodules >/= 1.5 cm not described
below (TR2): 0

_________________________________________________________

Nodule # 2:

Location: Left; Superior

Maximum size: 1.7 cm; Other 2 dimensions: 1.4 x 1.1 cm

Composition: solid/almost completely solid (2)

Echogenicity: hypoechoic (2)

Shape: not taller-than-wide (0)

Margins: smooth (0)

Echogenic foci: none (0)

ACR TI-RADS total points: 4.

ACR TI-RADS risk category: TR4 (4-6 points).

ACR TI-RADS recommendations:

**Given size (>/= 1.5 cm) and appearance, fine needle aspiration of
this moderately suspicious nodule should be considered based on
TI-RADS criteria.

_________________________________________________________

Additional small subcentimeter thyroid nodules bilaterally which do
not meet criteria to warrant further evaluation.
IMPRESSION: Approximately 1.7 cm TI-RADS category 4 nodule in the left superior
gland meets criteria to consider fine-needle aspiration biopsy.

The background thyroid gland is moderately heterogeneous in
appearance.

The above is in keeping with the ACR TI-RADS recommendations - [HOSPITAL] 5678;[DATE].

## 2022-09-25 IMAGING — NM NM THYROID IMAGING W/ UPTAKE MULTI (4&24 HR)
4 series · 4 of 4 positions shown · non-contrast
Comparison: None

Correlation: Thyroid ultrasound 06/08/2021

CLINICAL DATA: Hyperthyroidism, suppressed TSH 0.01, family history
of Graves disease, weight loss for 6 months

EXAM:
THYROID SCAN AND UPTAKE - 4 AND 24 HOURS
TECHNIQUE: Following oral administration of P-PWQ capsule, anterior planar
imaging was acquired at 24 hours. Thyroid uptake was calculated with
a thyroid probe at 4-6 hours and 24 hours.
RADIOPHARMACEUTICALS:  434 uCi P-PWQ sodium iodide p.o.

[Series 1: anterior · 1.18mm/px · 1 of 1 slices shown]
[im 1/1]
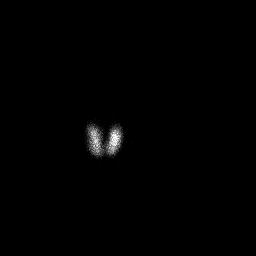

[Series 2: ant w marker · 1.18mm/px · 1 of 1 slices shown]
[im 1/1]
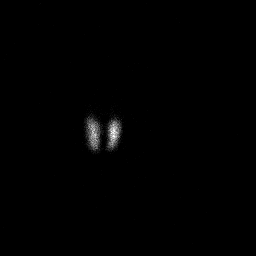

[Series 3: lao · 1.18mm/px · 1 of 1 slices shown]
[im 1/1  full-range]
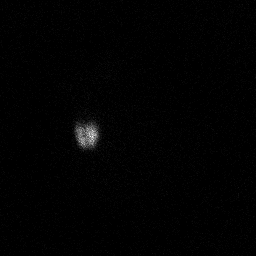

[Series 4: rao · 1.18mm/px · 1 of 1 slices shown]
[im 1/1]
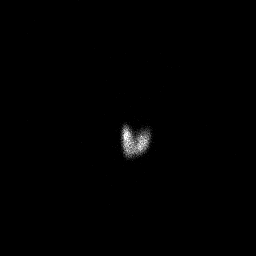

[4 of 4 positions shown; findings below may reference images not displayed]

FINDINGS: Homogeneous tracer distribution throughout both thyroid lobes.

No focal areas of increased or decreased tracer localization seen.

4 hour P-PWQ uptake = 26.2% (normal 5-20%)

24 hour P-PWQ uptake = 51.0% (normal 10-30%)
IMPRESSION: Normal thyroid scan with elevated 4 hour and 24 hour radio iodine
uptakes consistent with Graves disease.

## 2022-10-11 IMAGING — NM NM RAI THERAPY FOR HYPERTHYROIDISM
1 series · 1 of 1 positions shown · non-contrast
Comparison: Thirty thickened scan 07/14/2021

CLINICAL DATA: Hyperthyroid symptoms including irritability, weight
loss, tremors and insomnia for 6 months. Depressed TSH equal less
than 0.01. Elevated I 131 uptake on thyroid scan (51%.) Findings
consistent with Graves disease.

EXAM:
RADIOACTIVE IODINE THERAPY FOR HYPERTHYROIDISM
TECHNIQUE: Radioactive iodine prescribed by Dr. Archen. The risks and benefits
of radioactive iodine therapy were discussed with the patient in
detail by Dr. Dajon. Alternative therapies were also mentioned.
Radiation safety was discussed with the patient, including how to
protect the general public from exposure. There were no barriers to
communication. Written consent was obtained. The patient then
received a capsule containing the radiopharmaceutical.
The patient will follow-up with the referring physician.
RADIOPHARMACEUTICALS:  15.0 mCi F-5A5 sodium iodide orally

[Series 1: bone statics · 2.07mm/px · 1 of 1 slices shown]
[im 1/1]
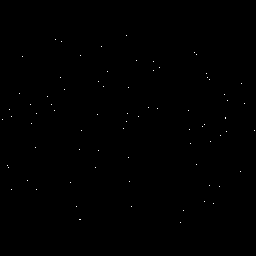

[1 of 1 positions shown; findings below may reference images not displayed]

IMPRESSION: Per oral administration of F-5A5 sodium iodide for the treatment of
Graves disease.

## 2022-10-25 ENCOUNTER — Other Ambulatory Visit (HOSPITAL_COMMUNITY)
Admission: RE | Admit: 2022-10-25 | Discharge: 2022-10-25 | Disposition: A | Discharge status: Home / Self Care | Payer: Medicaid Other | Source: Ambulatory Visit | Attending: Nurse Practitioner | Admitting: Nurse Practitioner

## 2022-10-25 DIAGNOSIS — E890 Postprocedural hypothyroidism: Secondary | ICD-10-CM | POA: Diagnosis not present

## 2022-10-25 LAB — TSH: TSH: 114.291 u[IU]/mL — ABNORMAL HIGH (ref 0.350–4.500)

## 2022-10-25 LAB — T4, FREE: Free T4: 0.43 ng/dL — ABNORMAL LOW (ref 0.61–1.12)

## 2022-11-01 NOTE — Patient Instructions (Signed)

## 2022-11-02 ENCOUNTER — Ambulatory Visit: Payer: Medicaid Other | Admitting: Nurse Practitioner

## 2022-11-02 ENCOUNTER — Encounter: Payer: Self-pay | Admitting: Nurse Practitioner

## 2022-11-02 VITALS — BP 120/74 | HR 79 | Ht 62.25 in | Wt 130.6 lb

## 2022-11-02 DIAGNOSIS — E890 Postprocedural hypothyroidism: Secondary | ICD-10-CM

## 2022-11-02 NOTE — Progress Notes (Signed)
11/02/2022     Endocrinology Follow Up Note    Subjective:    Patient ID: Monica Cisneros, female    DOB: 1960/11/01, PCP Zhou-Talbert, Elwyn Lade, MD.   Past Medical History:  Diagnosis Date   Diabetes mellitus, type II ([place])    Hyperthyroidism     History reviewed. No pertinent surgical history.  Social History   Socioeconomic History   Marital status: Married    Spouse name: Not on file   Number of children: Not on file   Years of education: Not on file   Highest education level: Not on file  Occupational History   Not on file  Tobacco Use   Smoking status: Every Day    Packs/day: 1.00    Types: Cigarettes   Smokeless tobacco: Never  Vaping Use   Vaping Use: Never used  Substance and Sexual Activity   Alcohol use: Not Currently   Drug use: No   Sexual activity: Not on file  Other Topics Concern   Not on file  Social History Narrative   Not on file   Social Determinants of Health   Financial Resource Strain: Not on file  Food Insecurity: Not on file  Transportation Needs: Not on file  Physical Activity: Not on file  Stress: Not on file  Social Connections: Not on file    History reviewed. No pertinent family history.  Outpatient Encounter Medications as of 11/02/2022  Medication Sig   aspirin 81 MG chewable tablet Chew 81 mg by mouth daily.   atorvastatin (LIPITOR) 40 MG tablet Take 40 mg by mouth daily.   cyclobenzaprine (FLEXERIL) 10 MG tablet Take 10 mg by mouth 3 (three) times daily.   escitalopram (LEXAPRO) 10 MG tablet Take 10 mg by mouth daily.   hydrOXYzine (ATARAX/VISTARIL) 25 MG tablet 1 tablet as needed   insulin detemir (LEVEMIR FLEXTOUCH) 100 UNIT/ML FlexPen Inject 15 Units into the skin at bedtime.   levothyroxine (SYNTHROID) 100 MCG tablet Take 1 tablet (100 mcg total) by mouth daily before breakfast.   lisinopril (ZESTRIL) 10 MG tablet Take 10 mg by mouth daily.   metFORMIN (GLUCOPHAGE) 500 MG tablet Take 500 mg by mouth 2  (two) times daily.   naproxen (NAPROSYN) 500 MG tablet Take 500 mg by mouth 2 (two) times daily. For neck   Ascorbic Acid (VITAMIN C) 1000 MG tablet 1 tablet (Patient not taking: Reported on 11/02/2022)   cholecalciferol (VITAMIN D3) 25 MCG (1000 UNIT) tablet 1 tablet (Patient not taking: Reported on 11/02/2022)   Omega-3 Fatty Acids (FISH OIL) 1000 MG CAPS 1 capsule   No facility-administered encounter medications on file as of 11/02/2022.    ALLERGIES: No Known Allergies  VACCINATION STATUS:  There is no immunization history on file for this patient.   HPI  Monica Cisneros is 62 y.o. female who presents today with a medical history as above. she is being seen in follow up after being seen in consultation for hyperthyroidism requested by Zhou-Talbert, Elwyn Lade, MD.  she has been dealing with symptoms of irritability, unintentional weight loss, tremors, and insomnia for about 6 months.   she denies dysphagia, choking, shortness of breath, no recent voice change.    she does have extensive family history of thyroid dysfunction in her daughters, siblings, and parents, but denies family hx of thyroid cancer. she denies personal history of goiter. she is not on any anti-thyroid medications nor on any thyroid hormone supplements. Denies use of Biotin containing supplements.  She had RAI ablation for hyperthyroidism in October 2022, now on thyroid hormone replacement.  Review of systems  Constitutional: +stable body weight,  current Body mass index is 23.7 kg/m. , + fatigue, no subjective hyperthermia, no subjective hypothermia Eyes: no blurry vision, no xerophthalmia ENT: no sore throat, no nodules palpated in throat, no dysphagia/odynophagia, no hoarseness Cardiovascular: no chest pain, no shortness of breath, no palpitations, no leg swelling Respiratory: no cough, no shortness of breath Gastrointestinal: no nausea/vomiting/diarrhea Musculoskeletal: generalized muscle/joint aches Skin:  no rashes, no hyperemia Neurological: no tremors, no numbness, no tingling, no dizziness Psychiatric: no depression, no anxiety   Objective:    BP 120/74 (BP Location: Left Arm, Patient Position: Sitting, Cuff Size: Normal)   Pulse 79   Ht 5' 2.25" (1.581 m)   Wt 130 lb 9.6 oz (59.2 kg)   LMP  (LMP Unknown)   BMI 23.70 kg/m   Wt Readings from Last 3 Encounters:  11/02/22 130 lb 9.6 oz (59.2 kg)  08/01/22 129 lb 12.8 oz (58.9 kg)  04/29/22 139 lb (63 kg)     BP Readings from Last 3 Encounters:  11/02/22 120/74  08/01/22 121/67  04/29/22 114/70                         Physical Exam- Limited  Constitutional:  Body mass index is 23.7 kg/m. , not in acute distress, normal state of mind Eyes:  EOMI, no exophthalmos Musculoskeletal: no gross deformities, strength intact in all four extremities, no gross restriction of joint movements Skin:  no rashes, no hyperemia Neurological: no tremor with outstretched hands   CMP     Component Value Date/Time   NA 139 09/23/2014 2114   K 3.7 09/23/2014 2114   CL 101 09/23/2014 2114   CO2 22 09/23/2014 2114   GLUCOSE 168 (H) 09/23/2014 2114   BUN 10 09/23/2014 2114   CREATININE 0.79 09/23/2014 2114   CALCIUM 9.5 09/23/2014 2114   GFRNONAA >90 09/23/2014 2114   GFRAA >90 09/23/2014 2114     CBC    Component Value Date/Time   WBC 14.9 (H) 09/23/2014 2114   RBC 4.63 09/23/2014 2114   HGB 13.8 09/23/2014 2114   HCT 41.3 09/23/2014 2114   PLT 255 09/23/2014 2114   MCV 89.2 09/23/2014 2114   MCH 29.8 09/23/2014 2114   MCHC 33.4 09/23/2014 2114   RDW 13.1 09/23/2014 2114   LYMPHSABS 2.7 09/23/2014 2114   MONOABS 1.1 (H) 09/23/2014 2114   EOSABS 0.2 09/23/2014 2114   BASOSABS 0.0 09/23/2014 2114     Diabetic Labs (most recent): No results found for: "HGBA1C", "MICROALBUR"  Lipid Panel  No results found for: "CHOL", "TRIG", "HDL", "CHOLHDL", "VLDL", "LDLCALC", "LDLDIRECT", "LABVLDL"   Lab Results  Component Value  Date   TSH 114.291 (H) 10/25/2022   TSH <0.010 (L) 07/25/2022   TSH 1.511 04/15/2022   TSH 8.586 (H) 01/17/2022   TSH 38.296 (H) 11/12/2021   TSH 0.245 (L) 09/28/2021   TSH <0.010 (L) 09/07/2021   TSH 0.01 (A) 05/26/2021   FREET4 0.43 (L) 10/25/2022   FREET4 1.35 (H) 07/25/2022   FREET4 0.94 04/15/2022   FREET4 0.82 01/17/2022   FREET4 0.57 (L) 11/12/2021   FREET4 0.38 (L) 09/28/2021   FREET4 0.91 09/07/2021     Thyroid US from 06/08/2021 CLINICAL DATA:  Hyperthyroid.   EXAM: THYROID ULTRASOUND   TECHNIQUE: Ultrasound examination of the thyroid gland and adjacent soft tissues was  performed.   COMPARISON:  None.   FINDINGS: Parenchymal Echotexture: Moderately heterogenous   Isthmus: 0.2 cm   Right lobe: 4.6 x 2.0 x 1.4 cm   Left lobe: 4.2 x 1.7 x 1.4 cm   _________________________________________________________   Estimated total number of nodules >/= 1 cm: 1   Number of spongiform nodules >/=  2 cm not described below (TR1): 0   Number of mixed cystic and solid nodules >/= 1.5 cm not described below (TR2): 0   _________________________________________________________   Nodule # 2:   Location: Left; Superior   Maximum size: 1.7 cm; Other 2 dimensions: 1.4 x 1.1 cm   Composition: solid/almost completely solid (2)   Echogenicity: hypoechoic (2)   Shape: not taller-than-wide (0)   Margins: smooth (0)   Echogenic foci: none (0)   ACR TI-RADS total points: 4.   ACR TI-RADS risk category: TR4 (4-6 points).   ACR TI-RADS recommendations:   **Given size (>/= 1.5 cm) and appearance, fine needle aspiration of this moderately suspicious nodule should be considered based on TI-RADS criteria.   _________________________________________________________   Additional small subcentimeter thyroid nodules bilaterally which do not meet criteria to warrant further evaluation.   IMPRESSION: Approximately 1.7 cm TI-RADS category 4 nodule in the left  superior gland meets criteria to consider fine-needle aspiration biopsy.   The background thyroid gland is moderately heterogeneous in appearance.   The above is in keeping with the ACR TI-RADS recommendations - J Am Coll Radiol 2017;14:587-595.     Electronically Signed   By: Jacqulynn Cadet M.D.   On: 06/08/2021 11:46 -----------------------------------------------------------------------------------------------------------------------------  Uptake and Scan from 07/14/21 CLINICAL DATA:  Hyperthyroidism, suppressed TSH 0.01, family history of Graves disease, weight loss for 6 months   EXAM: THYROID SCAN AND UPTAKE - 4 AND 24 HOURS   TECHNIQUE: Following oral administration of I-123 capsule, anterior planar imaging was acquired at 24 hours. Thyroid uptake was calculated with a thyroid probe at 4-6 hours and 24 hours.   RADIOPHARMACEUTICALS:  434 uCi I-123 sodium iodide p.o.   COMPARISON:  None   Correlation: Thyroid ultrasound 06/08/2021   FINDINGS: Homogeneous tracer distribution throughout both thyroid lobes.   No focal areas of increased or decreased tracer localization seen.   4 hour I-123 uptake = 26.2% (normal 5-20%)   24 hour I-123 uptake = 51.0% (normal 10-30%)   IMPRESSION: Normal thyroid scan with elevated 4 hour and 24 hour radio iodine uptakes consistent with Graves disease.     Electronically Signed   By: Lavonia Dana M.D.   On: 07/14/2021 17:12    Latest Reference Range & Units 04/15/22 12:22 07/25/22 11:39 10/25/22 11:04 10/25/22 14:05  TSH 0.350 - 4.500 uIU/mL  <0.010 (L) 114.291 (H)   T4,Free(Direct) 0.61 - 1.12 ng/dL 0.94 1.35 (H)  0.43 (L)  (L): Data is abnormally low (H): Data is abnormally high   Assessment & Plan:   1. 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  -She had RAI ablation in October 2022.    Her previsit thyroid function tests  are consistent with under-replacement.  She is advised to continue her Levothyroxine 100 mcg dose go back to taking it every day instead of skipping 1 day.   - The correct intake of thyroid hormone (Levothyroxine, Synthroid), is on empty stomach first thing in the morning, with water, separated by at least 30 minutes from breakfast and other medications,  and separated by more than 4 hours from calcium, iron, multivitamins, acid reflux medications (PPIs).  - This medication is a life-long medication and will be needed to correct thyroid hormone imbalances for the rest of your life.  The dose may change from time to time, based on thyroid blood work.  - It is extremely important to be consistent taking this medication, near the same time each morning.  -AVOID TAKING PRODUCTS CONTAINING BIOTIN (commonly found in Hair, Skin, Nails vitamins) AS IT INTERFERES WITH THE VALIDITY OF THYROID FUNCTION BLOOD TESTS.     -Patient is advised to maintain close follow up with Zhou-Talbert, Ralene Bathe, MD for primary care needs.     I spent 20 minutes in the care of the patient today including review of labs from Thyroid Function, CMP, and other relevant labs ; imaging/biopsy records (current and previous including abstractions from other facilities); face-to-face time discussing  her lab results and symptoms, medications doses, her options of short and long term treatment based on the latest standards of care / guidelines;   and documenting the encounter.  Monica Cisneros  participated in the discussions, expressed understanding, and voiced agreement with the above plans.  All questions were answered to her satisfaction. she is encouraged to contact clinic should she have any questions or concerns prior to her return visit.  Follow up plan: Return in about 3 months (around 02/01/2023) for Thyroid follow up, Previsit labs.   Thank you for involving me in the care of this pleasant patient, and I will continue to  update you with her progress.   Ronny Bacon, [facility] [address] [place], [place] [address] Phone: [phone] Fax: [phone]  11/02/2022, 9:37 AM

## 2023-01-24 ENCOUNTER — Other Ambulatory Visit (HOSPITAL_COMMUNITY)
Admission: RE | Admit: 2023-01-24 | Discharge: 2023-01-24 | Disposition: A | Discharge status: Home / Self Care | Payer: Medicaid Other | Source: Ambulatory Visit | Attending: Nurse Practitioner | Admitting: Nurse Practitioner

## 2023-01-24 DIAGNOSIS — E890 Postprocedural hypothyroidism: Secondary | ICD-10-CM | POA: Diagnosis present

## 2023-01-24 LAB — TSH: TSH: 0.155 u[IU]/mL — ABNORMAL LOW (ref 0.350–4.500)

## 2023-01-24 LAB — T4, FREE: Free T4: 1.18 ng/dL — ABNORMAL HIGH (ref 0.61–1.12)

## 2023-02-01 ENCOUNTER — Encounter: Payer: Self-pay | Admitting: Nurse Practitioner

## 2023-02-01 ENCOUNTER — Ambulatory Visit: Payer: Medicaid Other | Admitting: Nurse Practitioner

## 2023-02-01 VITALS — BP 138/88 | HR 123 | Ht 62.25 in | Wt 126.8 lb

## 2023-02-01 DIAGNOSIS — E890 Postprocedural hypothyroidism: Secondary | ICD-10-CM

## 2023-02-01 MED ORDER — LEVOTHYROXINE SODIUM 100 MCG PO TABS
100.0000 ug | ORAL_TABLET | Freq: Every day | ORAL | 1 refills | Status: DC
Start: 2023-02-01 — End: 2023-05-04

## 2023-02-01 NOTE — Progress Notes (Signed)
02/01/2023     Endocrinology Follow Up Note    Subjective:    Patient ID: Monica Cisneros, female    DOB: December 30, 1960, PCP Zhou-Talbert, Ralene Bathe, MD.   Past Medical History:  Diagnosis Date   Diabetes mellitus, type II    Hyperthyroidism     History reviewed. No pertinent surgical history.  Social History   Socioeconomic History   Marital status: Married    Spouse name: Not on file   Number of children: Not on file   Years of education: Not on file   Highest education level: Not on file  Occupational History   Not on file  Tobacco Use   Smoking status: Every Day    Packs/day: 1    Types: Cigarettes   Smokeless tobacco: Never  Vaping Use   Vaping Use: Never used  Substance and Sexual Activity   Alcohol use: Not Currently   Drug use: No   Sexual activity: Not on file  Other Topics Concern   Not on file  Social History Narrative   Not on file   Social Determinants of Health   Financial Resource Strain: Not on file  Food Insecurity: Not on file  Transportation Needs: Not on file  Physical Activity: Not on file  Stress: Not on file  Social Connections: Not on file    History reviewed. No pertinent family history.  Outpatient Encounter Medications as of 02/01/2023  Medication Sig   Ascorbic Acid (VITAMIN C) 1000 MG tablet    aspirin 81 MG chewable tablet Chew 81 mg by mouth daily.   atorvastatin (LIPITOR) 40 MG tablet Take 40 mg by mouth daily.   cholecalciferol (VITAMIN D3) 25 MCG (1000 UNIT) tablet    cyclobenzaprine (FLEXERIL) 10 MG tablet Take 10 mg by mouth 3 (three) times daily.   escitalopram (LEXAPRO) 10 MG tablet Take 10 mg by mouth daily.   hydrOXYzine (ATARAX/VISTARIL) 25 MG tablet 1 tablet as needed   insulin detemir (LEVEMIR FLEXTOUCH) 100 UNIT/ML FlexPen Inject 15 Units into the skin at bedtime.   lisinopril (ZESTRIL) 10 MG tablet Take 10 mg by mouth daily.   metFORMIN (GLUCOPHAGE) 500 MG tablet Take 500 mg by mouth 2 (two) times  daily.   naproxen (NAPROSYN) 500 MG tablet Take 500 mg by mouth 2 (two) times daily. For neck   Omega-3 Fatty Acids (FISH OIL) 1000 MG CAPS 1 capsule   [DISCONTINUED] levothyroxine (SYNTHROID) 100 MCG tablet Take 1 tablet (100 mcg total) by mouth daily before breakfast.   levothyroxine (SYNTHROID) 100 MCG tablet Take 1 tablet (100 mcg total) by mouth daily before breakfast.   No facility-administered encounter medications on file as of 02/01/2023.    ALLERGIES: No Known Allergies  VACCINATION STATUS:  There is no immunization history on file for this patient.   HPI  Monica Cisneros is 62 y.o. female who presents today with a medical history as above. she is being seen in follow up after being seen in consultation for hyperthyroidism requested by Zhou-Talbert, Ralene Bathe, MD.  she has been dealing with symptoms of irritability, unintentional weight loss, tremors, and insomnia for about 6 months.   she denies dysphagia, choking, shortness of breath, no recent voice change.    she does have extensive family history of thyroid dysfunction in her daughters, siblings, and parents, but denies family hx of thyroid cancer. she denies personal history of goiter. she is not on any anti-thyroid medications nor on any thyroid hormone supplements. Denies use  of Biotin containing supplements.   She had RAI ablation for hyperthyroidism in October 2022, now on thyroid hormone replacement.  Review of systems  Constitutional: +steadily decreasing body weight,  current Body mass index is 23.01 kg/m. , + fatigue, no subjective hyperthermia, no subjective hypothermia Eyes: no blurry vision, no xerophthalmia ENT: no sore throat, no nodules palpated in throat, no dysphagia/odynophagia, no hoarseness Cardiovascular: no chest pain, no shortness of breath, no palpitations, no leg swelling Respiratory: no cough, no shortness of breath Gastrointestinal: no nausea/vomiting/diarrhea Musculoskeletal: no muscle/joint  aches Skin: no rashes, no hyperemia Neurological: no tremors, no numbness, no tingling, no dizziness Psychiatric: no depression, no anxiety   Objective:    BP 138/88 (BP Location: Right Arm, Patient Position: Sitting, Cuff Size: Normal)   Pulse (!) 123   Ht 5' 2.25" (1.581 m)   Wt 126 lb 12.8 oz (57.5 kg)   LMP  (LMP Unknown)   BMI 23.01 kg/m   Wt Readings from Last 3 Encounters:  02/01/23 126 lb 12.8 oz (57.5 kg)  11/02/22 130 lb 9.6 oz (59.2 kg)  08/01/22 129 lb 12.8 oz (58.9 kg)     BP Readings from Last 3 Encounters:  02/01/23 138/88  11/02/22 120/74  08/01/22 121/67                         Physical Exam- Limited  Constitutional:  Body mass index is 23.01 kg/m. , not in acute distress, normal state of mind Eyes:  EOMI, no exophthalmos Musculoskeletal: no gross deformities, strength intact in all four extremities, no gross restriction of joint movements Skin:  no rashes, no hyperemia Neurological: no tremor with outstretched hands   CMP     Component Value Date/Time   NA 139 09/23/2014 2114   K 3.7 09/23/2014 2114   CL 101 09/23/2014 2114   CO2 22 09/23/2014 2114   GLUCOSE 168 (H) 09/23/2014 2114   BUN 10 09/23/2014 2114   CREATININE 0.79 09/23/2014 2114   CALCIUM 9.5 09/23/2014 2114   GFRNONAA >90 09/23/2014 2114   GFRAA >90 09/23/2014 2114     CBC    Component Value Date/Time   WBC 14.9 (H) 09/23/2014 2114   RBC 4.63 09/23/2014 2114   HGB 13.8 09/23/2014 2114   HCT 41.3 09/23/2014 2114   PLT 255 09/23/2014 2114   MCV 89.2 09/23/2014 2114   MCH 29.8 09/23/2014 2114   MCHC 33.4 09/23/2014 2114   RDW 13.1 09/23/2014 2114   LYMPHSABS 2.7 09/23/2014 2114   MONOABS 1.1 (H) 09/23/2014 2114   EOSABS 0.2 09/23/2014 2114   BASOSABS 0.0 09/23/2014 2114     Diabetic Labs (most recent): No results found for: "HGBA1C", "MICROALBUR"  Lipid Panel  No results found for: "CHOL", "TRIG", "HDL", "CHOLHDL", "VLDL", "LDLCALC", "LDLDIRECT",  "LABVLDL"   Lab Results  Component Value Date   TSH 0.155 (L) 01/24/2023   TSH 114.291 (H) 10/25/2022   TSH <0.010 (L) 07/25/2022   TSH 1.511 04/15/2022   TSH 8.586 (H) 01/17/2022   TSH 38.296 (H) 11/12/2021   TSH 0.245 (L) 09/28/2021   TSH <0.010 (L) 09/07/2021   TSH 0.01 (A) 05/26/2021   FREET4 1.18 (H) 01/24/2023   FREET4 0.43 (L) 10/25/2022   FREET4 1.35 (H) 07/25/2022   FREET4 0.94 04/15/2022   FREET4 0.82 01/17/2022   FREET4 0.57 (L) 11/12/2021   FREET4 0.38 (L) 09/28/2021   FREET4 0.91 09/07/2021     Thyroid US from 06/08/2021 CLINICAL  DATA:  Hyperthyroid.   EXAM: THYROID ULTRASOUND   TECHNIQUE: Ultrasound examination of the thyroid gland and adjacent soft tissues was performed.   COMPARISON:  None.   FINDINGS: Parenchymal Echotexture: Moderately heterogenous   Isthmus: 0.2 cm   Right lobe: 4.6 x 2.0 x 1.4 cm   Left lobe: 4.2 x 1.7 x 1.4 cm   _________________________________________________________   Estimated total number of nodules >/= 1 cm: 1   Number of spongiform nodules >/=  2 cm not described below (TR1): 0   Number of mixed cystic and solid nodules >/= 1.5 cm not described below (TR2): 0   _________________________________________________________   Nodule # 2:   Location: Left; Superior   Maximum size: 1.7 cm; Other 2 dimensions: 1.4 x 1.1 cm   Composition: solid/almost completely solid (2)   Echogenicity: hypoechoic (2)   Shape: not taller-than-wide (0)   Margins: smooth (0)   Echogenic foci: none (0)   ACR TI-RADS total points: 4.   ACR TI-RADS risk category: TR4 (4-6 points).   ACR TI-RADS recommendations:   **Given size (>/= 1.5 cm) and appearance, fine needle aspiration of this moderately suspicious nodule should be considered based on TI-RADS criteria.   _________________________________________________________   Additional small subcentimeter thyroid nodules bilaterally which do not meet criteria to warrant  further evaluation.   IMPRESSION: Approximately 1.7 cm TI-RADS category 4 nodule in the left superior gland meets criteria to consider fine-needle aspiration biopsy.   The background thyroid gland is moderately heterogeneous in appearance.   The above is in keeping with the ACR TI-RADS recommendations - J Am Coll Radiol 2017;14:587-595.     Electronically Signed   By: Malachy Moan M.D.   On: 06/08/2021 11:46 -----------------------------------------------------------------------------------------------------------------------------  Uptake and Scan from 07/14/21 CLINICAL DATA:  Hyperthyroidism, suppressed TSH 0.01, family history of Graves disease, weight loss for 6 months   EXAM: THYROID SCAN AND UPTAKE - 4 AND 24 HOURS   TECHNIQUE: Following oral administration of I-123 capsule, anterior planar imaging was acquired at 24 hours. Thyroid uptake was calculated with a thyroid probe at 4-6 hours and 24 hours.   RADIOPHARMACEUTICALS:  434 uCi I-123 sodium iodide p.o.   COMPARISON:  None   Correlation: Thyroid ultrasound 06/08/2021   FINDINGS: Homogeneous tracer distribution throughout both thyroid lobes.   No focal areas of increased or decreased tracer localization seen.   4 hour I-123 uptake = 26.2% (normal 5-20%)   24 hour I-123 uptake = 51.0% (normal 10-30%)   IMPRESSION: Normal thyroid scan with elevated 4 hour and 24 hour radio iodine uptakes consistent with Graves disease.     Electronically Signed   By: Ulyses Southward M.D.   On: 07/14/2021 17:12    Latest Reference Range & Units 07/25/22 11:39 10/25/22 11:04 10/25/22 14:05 01/24/23 11:19 01/24/23 11:20  TSH 0.350 - 4.500 uIU/mL <0.010 (L) 114.291 (H)  0.155 (L)   T4,Free(Direct) 0.61 - 1.12 ng/dL 1.61 (H)  0.96 (L)  0.45 (H)  (L): Data is abnormally low (H): Data is abnormally high   Assessment & Plan:   1. 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  -She had RAI ablation in October 2022.    Her previsit thyroid function tests are consistent slight over-replacement.  She is advised to continue her Levothyroxine 100 mcg dose go back to skipping 1 day of the week.  If this does not regulate her thyroid  levels, we may need to look at alternating doses between 88 and 100 mcg (total replacement dose should be around 90 mcg).   - The correct intake of thyroid hormone (Levothyroxine, Synthroid), is on empty stomach first thing in the morning, with water, separated by at least 30 minutes from breakfast and other medications,  and separated by more than 4 hours from calcium, iron, multivitamins, acid reflux medications (PPIs).  - This medication is a life-long medication and will be needed to correct thyroid hormone imbalances for the rest of your life.  The dose may change from time to time, based on thyroid blood work.  - It is extremely important to be consistent taking this medication, near the same time each morning.  -AVOID TAKING PRODUCTS CONTAINING BIOTIN (commonly found in Hair, Skin, Nails vitamins) AS IT INTERFERES WITH THE VALIDITY OF THYROID FUNCTION BLOOD TESTS.     -Patient is advised to maintain close follow up with Zhou-Talbert, Ralene Bathe, MD for primary care needs.     I spent  20  minutes in the care of the patient today including review of labs from Thyroid Function, CMP, and other relevant labs ; imaging/biopsy records (current and previous including abstractions from other facilities); face-to-face time discussing  her lab results and symptoms, medications doses, her options of short and long term treatment based on the latest standards of care / guidelines;   and documenting the encounter.  Zitlaly Soohoo  participated in the discussions, expressed understanding, and voiced agreement with the above plans.  All questions were answered to her satisfaction. she is encouraged to contact  clinic should she have any questions or concerns prior to her return visit.  Follow up plan: Return in about 3 months (around 05/03/2023) for Thyroid follow up, Previsit labs.   Thank you for involving me in the care of this pleasant patient, and I will continue to update you with her progress.   Ronny Bacon, [facility] [address] [place], [place] [address] Phone: [phone] Fax: [phone]  02/01/2023, 8:31 AM

## 2023-02-01 NOTE — Patient Instructions (Signed)

## 2023-04-26 ENCOUNTER — Other Ambulatory Visit (HOSPITAL_COMMUNITY)
Admission: RE | Admit: 2023-04-26 | Discharge: 2023-04-26 | Disposition: A | Discharge status: Home / Self Care | Payer: Medicaid Other | Source: Ambulatory Visit | Attending: Nurse Practitioner | Admitting: Nurse Practitioner

## 2023-04-26 DIAGNOSIS — E890 Postprocedural hypothyroidism: Secondary | ICD-10-CM | POA: Insufficient documentation

## 2023-04-26 LAB — TSH: TSH: 0.211 u[IU]/mL — ABNORMAL LOW (ref 0.350–4.500)

## 2023-04-26 LAB — T4, FREE: Free T4: 1.32 ng/dL — ABNORMAL HIGH (ref 0.61–1.12)

## 2023-05-03 NOTE — Patient Instructions (Signed)

## 2023-05-04 ENCOUNTER — Encounter: Payer: Self-pay | Admitting: Nurse Practitioner

## 2023-05-04 ENCOUNTER — Ambulatory Visit: Payer: Medicaid Other | Admitting: Nurse Practitioner

## 2023-05-04 VITALS — BP 96/63 | HR 69 | Ht 62.25 in | Wt 124.4 lb

## 2023-05-04 DIAGNOSIS — E890 Postprocedural hypothyroidism: Secondary | ICD-10-CM | POA: Diagnosis not present

## 2023-05-04 MED ORDER — LEVOTHYROXINE SODIUM 88 MCG PO TABS
88.0000 ug | ORAL_TABLET | Freq: Every day | ORAL | 1 refills | Status: DC
Start: 2023-05-04 — End: 2023-08-04

## 2023-05-04 NOTE — Progress Notes (Signed)
05/04/2023     Endocrinology Follow Up Note    Subjective:    Patient ID: Monica Cisneros, female    DOB: Feb 02, 1961, PCP Zhou-Talbert, Ralene Bathe, MD.   Past Medical History:  Diagnosis Date   Diabetes mellitus, type II (HCC)    Hyperthyroidism     History reviewed. No pertinent surgical history.  Social History   Socioeconomic History   Marital status: Married    Spouse name: Not on file   Number of children: Not on file   Years of education: Not on file   Highest education level: Not on file  Occupational History   Not on file  Tobacco Use   Smoking status: Every Day    Current packs/day: 1.00    Types: Cigarettes   Smokeless tobacco: Never  Vaping Use   Vaping status: Never Used  Substance and Sexual Activity   Alcohol use: Not Currently   Drug use: No   Sexual activity: Not on file  Other Topics Concern   Not on file  Social History Narrative   Not on file   Social Determinants of Health   Financial Resource Strain: Not on file  Food Insecurity: Not on file  Transportation Needs: Not on file  Physical Activity: Not on file  Stress: Not on file  Social Connections: Not on file    History reviewed. No pertinent family history.  Outpatient Encounter Medications as of 05/04/2023  Medication Sig   Ascorbic Acid (VITAMIN C) 1000 MG tablet    aspirin 81 MG chewable tablet Chew 81 mg by mouth daily.   atorvastatin (LIPITOR) 40 MG tablet Take 40 mg by mouth daily.   cholecalciferol (VITAMIN D3) 25 MCG (1000 UNIT) tablet    cyclobenzaprine (FLEXERIL) 10 MG tablet Take 10 mg by mouth 3 (three) times daily.   escitalopram (LEXAPRO) 10 MG tablet Take 10 mg by mouth daily.   hydrOXYzine (ATARAX/VISTARIL) 25 MG tablet 1 tablet as needed   insulin detemir (LEVEMIR FLEXTOUCH) 100 UNIT/ML FlexPen Inject 15 Units into the skin at bedtime.   lisinopril (ZESTRIL) 10 MG tablet Take 10 mg by mouth daily.   metFORMIN (GLUCOPHAGE) 500 MG tablet Take 500 mg by  mouth 2 (two) times daily.   naproxen (NAPROSYN) 500 MG tablet Take 500 mg by mouth 2 (two) times daily. For neck   Omega-3 Fatty Acids (FISH OIL) 1000 MG CAPS 1 capsule   [DISCONTINUED] levothyroxine (SYNTHROID) 100 MCG tablet Take 1 tablet (100 mcg total) by mouth daily before breakfast.   levothyroxine (SYNTHROID) 88 MCG tablet Take 1 tablet (88 mcg total) by mouth daily before breakfast.   No facility-administered encounter medications on file as of 05/04/2023.    ALLERGIES: No Known Allergies  VACCINATION STATUS:  There is no immunization history on file for this patient.   HPI  Monica Cisneros is 62 y.o. female who presents today with a medical history as above. she is being seen in follow up after being seen in consultation for hyperthyroidism requested by Zhou-Talbert, Ralene Bathe, MD.  she has been dealing with symptoms of irritability, unintentional weight loss, tremors, and insomnia for about 6 months.   she denies dysphagia, choking, shortness of breath, no recent voice change.    she does have extensive family history of thyroid dysfunction in her daughters, siblings, and parents, but denies family hx of thyroid cancer. she denies personal history of goiter. she is not on any anti-thyroid medications nor on any thyroid hormone supplements.  Denies use of Biotin containing supplements.   She had RAI ablation for hyperthyroidism in October 2022, now on thyroid hormone replacement.  Review of systems  Constitutional: + decreasing body weight,  current Body mass index is 22.57 kg/m. , no fatigue, no subjective hyperthermia, no subjective hypothermia Eyes: no blurry vision, no xerophthalmia ENT: no sore throat, no nodules palpated in throat, no dysphagia/odynophagia, no hoarseness Cardiovascular: no chest pain, no shortness of breath, + intermittent palpitations, no leg swelling Respiratory: no cough, no shortness of breath Gastrointestinal: no  nausea/vomiting/diarrhea Musculoskeletal: no muscle/joint aches Skin: no rashes, no hyperemia Neurological: no tremors, no numbness, no tingling, no dizziness Psychiatric: no depression, + intermittent anxiety   Objective:    BP 96/63 (BP Location: Right Arm, Patient Position: Sitting, Cuff Size: Normal)   Pulse 69   Ht 5' 2.25" (1.581 m)   Wt 124 lb 6.4 oz (56.4 kg)   LMP  (LMP Unknown)   BMI 22.57 kg/m   Wt Readings from Last 3 Encounters:  05/04/23 124 lb 6.4 oz (56.4 kg)  02/01/23 126 lb 12.8 oz (57.5 kg)  11/02/22 130 lb 9.6 oz (59.2 kg)     BP Readings from Last 3 Encounters:  05/04/23 96/63  02/01/23 138/88  11/02/22 120/74                         Physical Exam- Limited  Constitutional:  Body mass index is 22.57 kg/m. , not in acute distress, normal state of mind Eyes:  EOMI, no exophthalmos Musculoskeletal: no gross deformities, strength intact in all four extremities, no gross restriction of joint movements Skin:  no rashes, no hyperemia Neurological: no tremor with outstretched hands   CMP     Component Value Date/Time   NA 139 09/23/2014 2114   K 3.7 09/23/2014 2114   CL 101 09/23/2014 2114   CO2 22 09/23/2014 2114   GLUCOSE 168 (H) 09/23/2014 2114   BUN 10 09/23/2014 2114   CREATININE 0.79 09/23/2014 2114   CALCIUM 9.5 09/23/2014 2114   GFRNONAA >90 09/23/2014 2114   GFRAA >90 09/23/2014 2114     CBC    Component Value Date/Time   WBC 14.9 (H) 09/23/2014 2114   RBC 4.63 09/23/2014 2114   HGB 13.8 09/23/2014 2114   HCT 41.3 09/23/2014 2114   PLT 255 09/23/2014 2114   MCV 89.2 09/23/2014 2114   MCH 29.8 09/23/2014 2114   MCHC 33.4 09/23/2014 2114   RDW 13.1 09/23/2014 2114   LYMPHSABS 2.7 09/23/2014 2114   MONOABS 1.1 (H) 09/23/2014 2114   EOSABS 0.2 09/23/2014 2114   BASOSABS 0.0 09/23/2014 2114     Diabetic Labs (most recent): No results found for: "HGBA1C", "MICROALBUR"  Lipid Panel  No results found for: "CHOL", "TRIG",  "HDL", "CHOLHDL", "VLDL", "LDLCALC", "LDLDIRECT", "LABVLDL"   Lab Results  Component Value Date   TSH 0.211 (L) 04/26/2023   TSH 0.155 (L) 01/24/2023   TSH 114.291 (H) 10/25/2022   TSH <0.010 (L) 07/25/2022   TSH 1.511 04/15/2022   TSH 8.586 (H) 01/17/2022   TSH 38.296 (H) 11/12/2021   TSH 0.245 (L) 09/28/2021   TSH <0.010 (L) 09/07/2021   TSH 0.01 (A) 05/26/2021   FREET4 1.32 (H) 04/26/2023   FREET4 1.18 (H) 01/24/2023   FREET4 0.43 (L) 10/25/2022   FREET4 1.35 (H) 07/25/2022   FREET4 0.94 04/15/2022   FREET4 0.82 01/17/2022   FREET4 0.57 (L) 11/12/2021   FREET4 0.38 (L)  09/28/2021   FREET4 0.91 09/07/2021     Thyroid US from 06/08/2021 CLINICAL DATA:  Hyperthyroid.   EXAM: THYROID ULTRASOUND   TECHNIQUE: Ultrasound examination of the thyroid gland and adjacent soft tissues was performed.   COMPARISON:  None.   FINDINGS: Parenchymal Echotexture: Moderately heterogenous   Isthmus: 0.2 cm   Right lobe: 4.6 x 2.0 x 1.4 cm   Left lobe: 4.2 x 1.7 x 1.4 cm   _________________________________________________________   Estimated total number of nodules >/= 1 cm: 1   Number of spongiform nodules >/=  2 cm not described below (TR1): 0   Number of mixed cystic and solid nodules >/= 1.5 cm not described below (TR2): 0   _________________________________________________________   Nodule # 2:   Location: Left; Superior   Maximum size: 1.7 cm; Other 2 dimensions: 1.4 x 1.1 cm   Composition: solid/almost completely solid (2)   Echogenicity: hypoechoic (2)   Shape: not taller-than-wide (0)   Margins: smooth (0)   Echogenic foci: none (0)   ACR TI-RADS total points: 4.   ACR TI-RADS risk category: TR4 (4-6 points).   ACR TI-RADS recommendations:   **Given size (>/= 1.5 cm) and appearance, fine needle aspiration of this moderately suspicious nodule should be considered based on TI-RADS criteria.    _________________________________________________________   Additional small subcentimeter thyroid nodules bilaterally which do not meet criteria to warrant further evaluation.   IMPRESSION: Approximately 1.7 cm TI-RADS category 4 nodule in the left superior gland meets criteria to consider fine-needle aspiration biopsy.   The background thyroid gland is moderately heterogeneous in appearance.   The above is in keeping with the ACR TI-RADS recommendations - J Am Coll Radiol 2017;14:587-595.     Electronically Signed   By: Malachy Moan M.D.   On: 06/08/2021 11:46 -----------------------------------------------------------------------------------------------------------------------------  Uptake and Scan from 07/14/21 CLINICAL DATA:  Hyperthyroidism, suppressed TSH 0.01, family history of Graves disease, weight loss for 6 months   EXAM: THYROID SCAN AND UPTAKE - 4 AND 24 HOURS   TECHNIQUE: Following oral administration of I-123 capsule, anterior planar imaging was acquired at 24 hours. Thyroid uptake was calculated with a thyroid probe at 4-6 hours and 24 hours.   RADIOPHARMACEUTICALS:  434 uCi I-123 sodium iodide p.o.   COMPARISON:  None   Correlation: Thyroid ultrasound 06/08/2021   FINDINGS: Homogeneous tracer distribution throughout both thyroid lobes.   No focal areas of increased or decreased tracer localization seen.   4 hour I-123 uptake = 26.2% (normal 5-20%)   24 hour I-123 uptake = 51.0% (normal 10-30%)   IMPRESSION: Normal thyroid scan with elevated 4 hour and 24 hour radio iodine uptakes consistent with Graves disease.     Electronically Signed   By: Ulyses Southward M.D.   On: 07/14/2021 17:12    Latest Reference Range & Units 10/25/22 11:04 10/25/22 14:05 01/24/23 11:19 01/24/23 11:20 04/26/23 09:49  TSH 0.350 - 4.500 uIU/mL 114.291 (H)  0.155 (L)  0.211 (L)  T4,Free(Direct) 0.61 - 1.12 ng/dL  1.09 (L)  3.23 (H) 5.57 (H)  (H): Data is  abnormally high (L): Data is abnormally low   Assessment & Plan:   1. 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  -She had RAI ablation in October 2022.    Her previsit thyroid function tests are consistent with over-replacement.  She is advised to lower her Levothyroxine to 88 mcg po daily  before breakfast.  We did discuss alternating doses between 88 mcg and 100 mcg but she feels she would forget.   - The correct intake of thyroid hormone (Levothyroxine, Synthroid), is on empty stomach first thing in the morning, with water, separated by at least 30 minutes from breakfast and other medications,  and separated by more than 4 hours from calcium, iron, multivitamins, acid reflux medications (PPIs).  - This medication is a life-long medication and will be needed to correct thyroid hormone imbalances for the rest of your life.  The dose may change from time to time, based on thyroid blood work.  - It is extremely important to be consistent taking this medication, near the same time each morning.  -AVOID TAKING PRODUCTS CONTAINING BIOTIN (commonly found in Hair, Skin, Nails vitamins) AS IT INTERFERES WITH THE VALIDITY OF THYROID FUNCTION BLOOD TESTS.     -Patient is advised to maintain close follow up with Zhou-Talbert, Ralene Bathe, MD for primary care needs.      I spent  31  minutes in the care of the patient today including review of labs from Thyroid Function, CMP, and other relevant labs ; imaging/biopsy records (current and previous including abstractions from other facilities); face-to-face time discussing  her lab results and symptoms, medications doses, her options of short and long term treatment based on the latest standards of care / guidelines;   and documenting the encounter.  Maria Ignatowski  participated in the discussions, expressed understanding, and voiced agreement with the above  plans.  All questions were answered to her satisfaction. she is encouraged to contact clinic should she have any questions or concerns prior to her return visit.  Follow up plan: Return in about 3 months (around 08/04/2023) for Thyroid follow up, Previsit labs.   Thank you for involving me in the care of this pleasant patient, and I will continue to update you with her progress.   Ronny Bacon, [facility] [address] [place], [place] [address] Phone: [phone] Fax: [phone]  05/04/2023, 8:27 AM

## 2023-07-26 ENCOUNTER — Other Ambulatory Visit (HOSPITAL_COMMUNITY)
Admission: RE | Admit: 2023-07-26 | Discharge: 2023-07-26 | Disposition: A | Discharge status: Home / Self Care | Payer: Medicaid Other | Source: Ambulatory Visit | Attending: Nurse Practitioner | Admitting: Nurse Practitioner

## 2023-07-26 DIAGNOSIS — E890 Postprocedural hypothyroidism: Secondary | ICD-10-CM | POA: Insufficient documentation

## 2023-07-26 LAB — TSH: TSH: 2.594 u[IU]/mL (ref 0.350–4.500)

## 2023-07-26 LAB — T4, FREE: Free T4: 0.91 ng/dL (ref 0.61–1.12)

## 2023-08-04 ENCOUNTER — Encounter: Payer: Self-pay | Admitting: Nurse Practitioner

## 2023-08-04 ENCOUNTER — Ambulatory Visit: Payer: Medicaid Other | Admitting: Nurse Practitioner

## 2023-08-04 VITALS — BP 116/76 | HR 80 | Ht 62.25 in | Wt 127.0 lb

## 2023-08-04 DIAGNOSIS — E890 Postprocedural hypothyroidism: Secondary | ICD-10-CM

## 2023-08-04 MED ORDER — LEVOTHYROXINE SODIUM 88 MCG PO TABS
88.0000 ug | ORAL_TABLET | Freq: Every day | ORAL | 1 refills | Status: DC
Start: 2023-08-04 — End: 2023-11-15

## 2023-08-04 NOTE — Patient Instructions (Signed)

## 2023-08-04 NOTE — Progress Notes (Signed)
08/04/2023     Endocrinology Follow Up Note    Subjective:    Patient ID: Monica Cisneros, female    DOB: 22-Jan-1961, PCP Monica Cisneros, Monica Bathe, MD.   Past Medical History:  Diagnosis Date   Diabetes mellitus, type II (HCC)    Hyperthyroidism     History reviewed. No pertinent surgical history.  Social History   Socioeconomic History   Marital status: Married    Spouse name: Not on file   Number of children: Not on file   Years of education: Not on file   Highest education level: Not on file  Occupational History   Not on file  Tobacco Use   Smoking status: Every Day    Current packs/day: 1.00    Types: Cigarettes   Smokeless tobacco: Never  Vaping Use   Vaping status: Never Used  Substance and Sexual Activity   Alcohol use: Not Currently   Drug use: No   Sexual activity: Not on file  Other Topics Concern   Not on file  Social History Narrative   Not on file   Social Determinants of Health   Financial Resource Strain: Not on file  Food Insecurity: Not on file  Transportation Needs: Not on file  Physical Activity: Not on file  Stress: Not on file  Social Connections: Not on file    History reviewed. No pertinent family history.  Outpatient Encounter Medications as of 08/04/2023  Medication Sig   Ascorbic Acid (VITAMIN C) 1000 MG tablet    aspirin 81 MG chewable tablet Chew 81 mg by mouth daily.   atorvastatin (LIPITOR) 40 MG tablet Take 40 mg by mouth daily.   cholecalciferol (VITAMIN D3) 25 MCG (1000 UNIT) tablet    cyclobenzaprine (FLEXERIL) 10 MG tablet Take 10 mg by mouth 3 (three) times daily.   escitalopram (LEXAPRO) 10 MG tablet Take 10 mg by mouth daily.   furosemide (LASIX) 20 MG tablet Take 20 mg by mouth daily.   hydrOXYzine (ATARAX/VISTARIL) 25 MG tablet 1 tablet as needed   metFORMIN (GLUCOPHAGE) 500 MG tablet Take 500 mg by mouth 2 (two) times daily.   metoprolol tartrate (LOPRESSOR) 25 MG tablet Take 25 mg by mouth 2 (two)  times daily.   naproxen (NAPROSYN) 500 MG tablet Take 500 mg by mouth 2 (two) times daily. For neck   Omega-3 Fatty Acids (FISH OIL) 1000 MG CAPS 1 capsule   [DISCONTINUED] levothyroxine (SYNTHROID) 88 MCG tablet Take 1 tablet (88 mcg total) by mouth daily before breakfast.   insulin detemir (LEVEMIR FLEXTOUCH) 100 UNIT/ML FlexPen Inject 15 Units into the skin at bedtime.   levothyroxine (SYNTHROID) 88 MCG tablet Take 1 tablet (88 mcg total) by mouth daily before breakfast.   lisinopril (ZESTRIL) 10 MG tablet Take 10 mg by mouth daily. (Patient not taking: Reported on 08/04/2023)   No facility-administered encounter medications on file as of 08/04/2023.    ALLERGIES: No Known Allergies  VACCINATION STATUS:  There is no immunization history on file for this patient.   HPI  [name] is 62 y.o. female who presents today with a medical history as above. she is being seen in follow up after being seen in consultation for hyperthyroidism requested by Monica Cisneros, Monica Bathe, MD.  she has been dealing with symptoms of irritability, unintentional weight loss, tremors, and insomnia for about 6 months.   she denies dysphagia, choking, shortness of breath, no recent voice change.    she does have extensive family  history of thyroid dysfunction in her daughters, siblings, and parents, but denies family hx of thyroid cancer. she denies personal history of goiter. she is not on any anti-thyroid medications nor on any thyroid hormone supplements. Denies use of Biotin containing supplements.   She had RAI ablation for hyperthyroidism in October 2022, now on thyroid hormone replacement.  Review of systems  Constitutional: + stable body weight,  current Body mass index is 23.04 kg/m. , no fatigue, no subjective hyperthermia, no subjective hypothermia Eyes: no blurry vision, no xerophthalmia ENT: no sore throat, no nodules palpated in throat, no dysphagia/odynophagia, no  hoarseness Cardiovascular: no chest pain, no shortness of breath, + intermittent palpitations-improved (cardiology has changed her meds some), no leg swelling Respiratory: no cough, no shortness of breath Gastrointestinal: no nausea/vomiting/diarrhea Musculoskeletal: no muscle/joint aches Skin: no rashes, no hyperemia Neurological: no tremors, no numbness, no tingling, no dizziness Psychiatric: no depression, + intermittent anxiety   Objective:    BP 116/76 (BP Location: Right Arm, Patient Position: Sitting, Cuff Size: Large)   Pulse 80   Ht 5' 2.25" (1.581 m)   Wt 127 lb (57.6 kg)   LMP  (LMP Unknown)   BMI 23.04 kg/m   Wt Readings from Last 3 Encounters:  08/04/23 127 lb (57.6 kg)  05/04/23 124 lb 6.4 oz (56.4 kg)  02/01/23 126 lb 12.8 oz (57.5 kg)     BP Readings from Last 3 Encounters:  08/04/23 116/76  05/04/23 96/63  02/01/23 138/88                         Physical Exam- Limited  Constitutional:  Body mass index is 23.04 kg/m. , not in acute distress, normal state of mind Eyes:  EOMI, no exophthalmos Musculoskeletal: no gross deformities, strength intact in all four extremities, no gross restriction of joint movements Skin:  no rashes, no hyperemia Neurological: no tremor with outstretched hands   CMP     Component Value Date/Time   NA 139 09/23/2014 2114   K 3.7 09/23/2014 2114   CL 101 09/23/2014 2114   CO2 22 09/23/2014 2114   GLUCOSE 168 (H) 09/23/2014 2114   BUN 10 09/23/2014 2114   CREATININE 0.79 09/23/2014 2114   CALCIUM 9.5 09/23/2014 2114   GFRNONAA >90 09/23/2014 2114   GFRAA >90 09/23/2014 2114     CBC    Component Value Date/Time   WBC 14.9 (H) 09/23/2014 2114   RBC 4.63 09/23/2014 2114   HGB 13.8 09/23/2014 2114   HCT 41.3 09/23/2014 2114   PLT 255 09/23/2014 2114   MCV 89.2 09/23/2014 2114   MCH 29.8 09/23/2014 2114   MCHC 33.4 09/23/2014 2114   RDW 13.1 09/23/2014 2114   LYMPHSABS 2.7 09/23/2014 2114   MONOABS 1.1 (H)  09/23/2014 2114   EOSABS 0.2 09/23/2014 2114   BASOSABS 0.0 09/23/2014 2114     Diabetic Labs (most recent): No results found for: "HGBA1C", "MICROALBUR"  Lipid Panel  No results found for: "CHOL", "TRIG", "HDL", "CHOLHDL", "VLDL", "LDLCALC", "LDLDIRECT", "LABVLDL"   Lab Results  Component Value Date   TSH 2.594 07/26/2023   TSH 0.211 (L) 04/26/2023   TSH 0.155 (L) 01/24/2023   TSH 114.291 (H) 10/25/2022   TSH <0.010 (L) 07/25/2022   TSH 1.511 04/15/2022   TSH 8.586 (H) 01/17/2022   TSH 38.296 (H) 11/12/2021   TSH 0.245 (L) 09/28/2021   TSH <0.010 (L) 09/07/2021   FREET4 0.91 07/26/2023   FREET4  1.32 (H) 04/26/2023   FREET4 1.18 (H) 01/24/2023   FREET4 0.43 (L) 10/25/2022   FREET4 1.35 (H) 07/25/2022   FREET4 0.94 04/15/2022   FREET4 0.82 01/17/2022   FREET4 0.57 (L) 11/12/2021   FREET4 0.38 (L) 09/28/2021   FREET4 0.91 09/07/2021     Thyroid US from 06/08/2021 CLINICAL DATA:  Hyperthyroid.   EXAM: THYROID ULTRASOUND   TECHNIQUE: Ultrasound examination of the thyroid gland and adjacent soft tissues was performed.   COMPARISON:  None.   FINDINGS: Parenchymal Echotexture: Moderately heterogenous   Isthmus: 0.2 cm   Right lobe: 4.6 x 2.0 x 1.4 cm   Left lobe: 4.2 x 1.7 x 1.4 cm   _________________________________________________________   Estimated total number of nodules >/= 1 cm: 1   Number of spongiform nodules >/=  2 cm not described below (TR1): 0   Number of mixed cystic and solid nodules >/= 1.5 cm not described below (TR2): 0   _________________________________________________________   Nodule # 2:   Location: Left; Superior   Maximum size: 1.7 cm; Other 2 dimensions: 1.4 x 1.1 cm   Composition: solid/almost completely solid (2)   Echogenicity: hypoechoic (2)   Shape: not taller-than-wide (0)   Margins: smooth (0)   Echogenic foci: none (0)   ACR TI-RADS total points: 4.   ACR TI-RADS risk category: TR4 (4-6 points).   ACR  TI-RADS recommendations:   **Given size (>/= 1.5 cm) and appearance, fine needle aspiration of this moderately suspicious nodule should be considered based on TI-RADS criteria.   _________________________________________________________   Additional small subcentimeter thyroid nodules bilaterally which do not meet criteria to warrant further evaluation.   IMPRESSION: Approximately 1.7 cm TI-RADS category 4 nodule in the left superior gland meets criteria to consider fine-needle aspiration biopsy.   The background thyroid gland is moderately heterogeneous in appearance.   The above is in keeping with the ACR TI-RADS recommendations - J Am Coll Radiol 2017;14:587-595.     Electronically Signed   By: Malachy Moan M.D.   On: 06/08/2021 11:46 -----------------------------------------------------------------------------------------------------------------------------  Uptake and Scan from 07/14/21 CLINICAL DATA:  Hyperthyroidism, suppressed TSH 0.01, family history of Graves disease, weight loss for 6 months   EXAM: THYROID SCAN AND UPTAKE - 4 AND 24 HOURS   TECHNIQUE: Following oral administration of I-123 capsule, anterior planar imaging was acquired at 24 hours. Thyroid uptake was calculated with a thyroid probe at 4-6 hours and 24 hours.   RADIOPHARMACEUTICALS:  434 uCi I-123 sodium iodide p.o.   COMPARISON:  None   Correlation: Thyroid ultrasound 06/08/2021   FINDINGS: Homogeneous tracer distribution throughout both thyroid lobes.   No focal areas of increased or decreased tracer localization seen.   4 hour I-123 uptake = 26.2% (normal 5-20%)   24 hour I-123 uptake = 51.0% (normal 10-30%)   IMPRESSION: Normal thyroid scan with elevated 4 hour and 24 hour radio iodine uptakes consistent with Graves disease.     Electronically Signed   By: Ulyses Southward M.D.   On: 07/14/2021 17:12    Latest Reference Range & Units 10/25/22 11:04 10/25/22 14:05 01/24/23  11:19 01/24/23 11:20 04/26/23 09:49 07/26/23 11:03  TSH 0.350 - 4.500 uIU/mL 114.291 (H)  0.155 (L)  0.211 (L) 2.594  T4,Free(Direct) 0.61 - 1.12 ng/dL  1.61 (L)  0.96 (H) 0.45 (H) 0.91  (H): Data is abnormally high (L): Data is abnormally low   Assessment & Plan:   1. 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  -She had RAI ablation in October 2022.    Her previsit thyroid function tests are consistent with appropriate hormone replacement.  She is advised to continue her Levothyroxine 88 mcg po daily before breakfast.  She did not tolerate the 100 mcg in the past.   - The correct intake of thyroid hormone (Levothyroxine, Synthroid), is on empty stomach first thing in the morning, with water, separated by at least 30 minutes from breakfast and other medications,  and separated by more than 4 hours from calcium, iron, multivitamins, acid reflux medications (PPIs).  - This medication is a life-long medication and will be needed to correct thyroid hormone imbalances for the rest of your life.  The dose may change from time to time, based on thyroid blood work.  - It is extremely important to be consistent taking this medication, near the same time each morning.  -AVOID TAKING PRODUCTS CONTAINING BIOTIN (commonly found in Hair, Skin, Nails vitamins) AS IT INTERFERES WITH THE VALIDITY OF THYROID FUNCTION BLOOD TESTS.     -Patient is advised to maintain close follow up with Monica Cisneros, Monica Bathe, MD for primary care needs.     I spent  12  minutes in the care of the patient today including review of labs from Thyroid Function, CMP, and other relevant labs ; imaging/biopsy records (current and previous including abstractions from other facilities); face-to-face time discussing  her lab results and symptoms, medications doses, her options of short and long term treatment based on the latest standards of  care / guidelines;   and documenting the encounter.  Seren Touchet  participated in the discussions, expressed understanding, and voiced agreement with the above plans.  All questions were answered to her satisfaction. she is encouraged to contact clinic should she have any questions or concerns prior to her return visit.  Follow up plan: Return in about 3 months (around 11/04/2023) for Thyroid follow up, Previsit labs.   Thank you for involving me in the care of this pleasant patient, and I will continue to update you with her progress.   Ronny Bacon, [facility] [address] [place], [place] [address] Phone: [phone] Fax: [phone]  08/04/2023, 9:06 AM

## 2023-08-15 ENCOUNTER — Encounter: Payer: Self-pay | Admitting: Internal Medicine

## 2023-08-15 ENCOUNTER — Ambulatory Visit: Payer: Medicaid Other | Attending: Internal Medicine | Admitting: Internal Medicine

## 2023-08-15 NOTE — Progress Notes (Signed)
Erroneous encounter - please disregard.

## 2023-08-17 ENCOUNTER — Encounter: Payer: Self-pay | Admitting: Internal Medicine

## 2023-09-22 LAB — LAB REPORT - SCANNED: EGFR: 49

## 2023-11-01 ENCOUNTER — Other Ambulatory Visit (HOSPITAL_COMMUNITY)
Admission: RE | Admit: 2023-11-01 | Discharge: 2023-11-01 | Disposition: A | Discharge status: Home / Self Care | Payer: Medicaid Other | Source: Ambulatory Visit | Attending: Nurse Practitioner | Admitting: Nurse Practitioner

## 2023-11-01 DIAGNOSIS — E890 Postprocedural hypothyroidism: Secondary | ICD-10-CM | POA: Insufficient documentation

## 2023-11-01 LAB — TSH: TSH: 9.663 u[IU]/mL — ABNORMAL HIGH (ref 0.350–4.500)

## 2023-11-01 LAB — T4, FREE: Free T4: 0.91 ng/dL (ref 0.61–1.12)

## 2023-11-06 NOTE — Patient Instructions (Incomplete)

## 2023-11-08 ENCOUNTER — Ambulatory Visit: Payer: Medicaid Other | Admitting: Nurse Practitioner

## 2023-11-08 DIAGNOSIS — E890 Postprocedural hypothyroidism: Secondary | ICD-10-CM

## 2023-11-14 NOTE — Patient Instructions (Signed)

## 2023-11-15 ENCOUNTER — Encounter: Payer: Self-pay | Admitting: Nurse Practitioner

## 2023-11-15 ENCOUNTER — Ambulatory Visit: Payer: Medicaid Other | Admitting: Nurse Practitioner

## 2023-11-15 VITALS — BP 140/72 | HR 74 | Ht 62.25 in | Wt 130.6 lb

## 2023-11-15 DIAGNOSIS — E890 Postprocedural hypothyroidism: Secondary | ICD-10-CM

## 2023-11-15 MED ORDER — LEVOTHYROXINE SODIUM 88 MCG PO TABS
88.0000 ug | ORAL_TABLET | Freq: Every day | ORAL | 1 refills | Status: DC
Start: 2023-11-15 — End: 2024-02-15

## 2023-11-15 NOTE — Progress Notes (Signed)
 11/15/2023     Endocrinology Follow Up Note    Subjective:    Patient ID: Monica Cisneros, female    DOB: Dec 11, 1960, PCP Zhou-Talbert, Stephens RAMAN, MD.   Past Medical History:  Diagnosis Date   Diabetes mellitus, type II (HCC)    Hyperthyroidism     History reviewed. No pertinent surgical history.  Social History   Socioeconomic History   Marital status: Married    Spouse name: Not on file   Number of children: Not on file   Years of education: Not on file   Highest education level: Not on file  Occupational History   Not on file  Tobacco Use   Smoking status: Every Day    Current packs/day: 1.00    Types: Cigarettes   Smokeless tobacco: Never  Vaping Use   Vaping status: Never Used  Substance and Sexual Activity   Alcohol use: Not Currently   Drug use: No   Sexual activity: Not on file  Other Topics Concern   Not on file  Social History Narrative   Not on file   Social Drivers of Health   Financial Resource Strain: Not on file  Food Insecurity: Not on file  Transportation Needs: Not on file  Physical Activity: Not on file  Stress: Not on file  Social Connections: Not on file    History reviewed. No pertinent family history.  Outpatient Encounter Medications as of 11/15/2023  Medication Sig   Ascorbic Acid (VITAMIN C) 1000 MG tablet    aspirin  81 MG chewable tablet Chew 81 mg by mouth daily.   atorvastatin (LIPITOR) 40 MG tablet Take 40 mg by mouth daily.   cholecalciferol (VITAMIN D3) 25 MCG (1000 UNIT) tablet    cyclobenzaprine (FLEXERIL) 10 MG tablet Take 10 mg by mouth 3 (three) times daily.   escitalopram (LEXAPRO) 10 MG tablet Take 10 mg by mouth daily.   furosemide (LASIX) 20 MG tablet Take 20 mg by mouth daily.   hydrOXYzine (ATARAX/VISTARIL) 25 MG tablet 1 tablet as needed   LANTUS SOLOSTAR 100 UNIT/ML Solostar Pen Inject 13 Units into the skin at bedtime.   metFORMIN (GLUCOPHAGE) 500 MG tablet Take 500 mg by mouth 2 (two) times daily.    metoprolol tartrate (LOPRESSOR) 25 MG tablet Take 25 mg by mouth 2 (two) times daily.   naproxen (NAPROSYN) 500 MG tablet Take 500 mg by mouth 2 (two) times daily. For neck   Omega-3 Fatty Acids (FISH OIL) 1000 MG CAPS 1 capsule   [DISCONTINUED] levothyroxine  (SYNTHROID ) 88 MCG tablet Take 1 tablet (88 mcg total) by mouth daily before breakfast.   levothyroxine  (SYNTHROID ) 88 MCG tablet Take 1 tablet (88 mcg total) by mouth daily before breakfast.   lisinopril (ZESTRIL) 10 MG tablet Take 10 mg by mouth daily. (Patient not taking: Reported on 11/15/2023)   [DISCONTINUED] insulin detemir (LEVEMIR FLEXTOUCH) 100 UNIT/ML FlexPen Inject 15 Units into the skin at bedtime. (Patient not taking: Reported on 11/15/2023)   No facility-administered encounter medications on file as of 11/15/2023.    ALLERGIES: No Known Allergies  VACCINATION STATUS:  There is no immunization history on file for this patient.   HPI  Monica Cisneros is 63 y.o. female who presents today with a medical history as above. she is being seen in follow up after being seen in consultation for hyperthyroidism requested by Zhou-Talbert, Stephens RAMAN, MD.  she has been dealing with symptoms of irritability, unintentional weight loss, tremors, and insomnia for about  6 months.   she denies dysphagia, choking, shortness of breath, no recent voice change.    she does have extensive family history of thyroid  dysfunction in her daughters, siblings, and parents, but denies family hx of thyroid  cancer. she denies personal history of goiter. she is not on any anti-thyroid  medications nor on any thyroid  hormone supplements. Denies use of Biotin containing supplements.   She had RAI ablation for hyperthyroidism in October 2022, now on thyroid  hormone replacement.  Review of systems  Constitutional: + Minimally fluctuating body weight,  current Body mass index is 23.7 kg/m. , no fatigue, no subjective hyperthermia, no subjective hypothermia Eyes:  no blurry vision, no xerophthalmia ENT: no sore throat, no nodules palpated in throat, no dysphagia/odynophagia, no hoarseness Cardiovascular: no chest pain, no shortness of breath, no palpitations, no leg swelling Respiratory: no cough, no shortness of breath Gastrointestinal: no nausea/vomiting/diarrhea Musculoskeletal: no muscle/joint aches Skin: no rashes, no hyperemia Neurological: no tremors, no numbness, no tingling, no dizziness Psychiatric: no depression, no anxiety   Objective:    BP (!) 140/72 (BP Location: Left Arm, Patient Position: Sitting, Cuff Size: Large)   Pulse 74   Ht 5' 2.25 (1.581 m)   Wt 130 lb 9.6 oz (59.2 kg)   LMP  (LMP Unknown)   BMI 23.70 kg/m   Wt Readings from Last 3 Encounters:  11/15/23 130 lb 9.6 oz (59.2 kg)  08/04/23 127 lb (57.6 kg)  05/04/23 124 lb 6.4 oz (56.4 kg)     BP Readings from Last 3 Encounters:  11/15/23 (!) 140/72  08/04/23 116/76  05/04/23 96/63                         Physical Exam- Limited  Constitutional:  Body mass index is 23.7 kg/m. , not in acute distress, normal state of mind Eyes:  EOMI, no exophthalmos Cardiovascular: RRR, no murmurs, rubs, or gallops, no edema Musculoskeletal: no gross deformities, strength intact in all four extremities, no gross restriction of joint movements Skin:  no rashes, no hyperemia Neurological: no tremor with outstretched hands   CMP     Component Value Date/Time   NA 139 09/23/2014 2114   K 3.7 09/23/2014 2114   CL 101 09/23/2014 2114   CO2 22 09/23/2014 2114   GLUCOSE 168 (H) 09/23/2014 2114   BUN 10 09/23/2014 2114   CREATININE 0.79 09/23/2014 2114   CALCIUM 9.5 09/23/2014 2114   GFRNONAA >90 09/23/2014 2114   GFRAA >90 09/23/2014 2114     CBC    Component Value Date/Time   WBC 14.9 (H) 09/23/2014 2114   RBC 4.63 09/23/2014 2114   HGB 13.8 09/23/2014 2114   HCT 41.3 09/23/2014 2114   PLT 255 09/23/2014 2114   MCV 89.2 09/23/2014 2114   MCH 29.8 09/23/2014  2114   MCHC 33.4 09/23/2014 2114   RDW 13.1 09/23/2014 2114   LYMPHSABS 2.7 09/23/2014 2114   MONOABS 1.1 (H) 09/23/2014 2114   EOSABS 0.2 09/23/2014 2114   BASOSABS 0.0 09/23/2014 2114     Diabetic Labs (most recent): No results found for: HGBA1C, MICROALBUR  Lipid Panel  No results found for: CHOL, TRIG, HDL, CHOLHDL, VLDL, LDLCALC, LDLDIRECT, LABVLDL   Lab Results  Component Value Date   TSH 9.663 (H) 11/01/2023   TSH 2.594 07/26/2023   TSH 0.211 (L) 04/26/2023   TSH 0.155 (L) 01/24/2023   TSH 114.291 (H) 10/25/2022   TSH <0.010 (L) 07/25/2022  TSH 1.511 04/15/2022   TSH 8.586 (H) 01/17/2022   TSH 38.296 (H) 11/12/2021   TSH 0.245 (L) 09/28/2021   FREET4 0.91 11/01/2023   FREET4 0.91 07/26/2023   FREET4 1.32 (H) 04/26/2023   FREET4 1.18 (H) 01/24/2023   FREET4 0.43 (L) 10/25/2022   FREET4 1.35 (H) 07/25/2022   FREET4 0.94 04/15/2022   FREET4 0.82 01/17/2022   FREET4 0.57 (L) 11/12/2021   FREET4 0.38 (L) 09/28/2021     Thyroid  Us  from 06/08/2021 CLINICAL DATA:  Hyperthyroid.   EXAM: THYROID  ULTRASOUND   TECHNIQUE: Ultrasound examination of the thyroid  gland and adjacent soft tissues was performed.   COMPARISON:  None.   FINDINGS: Parenchymal Echotexture: Moderately heterogenous   Isthmus: 0.2 cm   Right lobe: 4.6 x 2.0 x 1.4 cm   Left lobe: 4.2 x 1.7 x 1.4 cm   _________________________________________________________   Estimated total number of nodules >/= 1 cm: 1   Number of spongiform nodules >/=  2 cm not described below (TR1): 0   Number of mixed cystic and solid nodules >/= 1.5 cm not described below (TR2): 0   _________________________________________________________   Nodule # 2:   Location: Left; Superior   Maximum size: 1.7 cm; Other 2 dimensions: 1.4 x 1.1 cm   Composition: solid/almost completely solid (2)   Echogenicity: hypoechoic (2)   Shape: not taller-than-wide (0)   Margins: smooth (0)    Echogenic foci: none (0)   ACR TI-RADS total points: 4.   ACR TI-RADS risk category: TR4 (4-6 points).   ACR TI-RADS recommendations:   **Given size (>/= 1.5 cm) and appearance, fine needle aspiration of this moderately suspicious nodule should be considered based on TI-RADS criteria.   _________________________________________________________   Additional small subcentimeter thyroid  nodules bilaterally which do not meet criteria to warrant further evaluation.   IMPRESSION: Approximately 1.7 cm TI-RADS category 4 nodule in the left superior gland meets criteria to consider fine-needle aspiration biopsy.   The background thyroid  gland is moderately heterogeneous in appearance.   The above is in keeping with the ACR TI-RADS recommendations - J Am Coll Radiol 2017;14:587-595.     Electronically Signed   By: Wilkie Lent M.D.   On: 06/08/2021 11:46 -----------------------------------------------------------------------------------------------------------------------------  Uptake and Scan from 07/14/21 CLINICAL DATA:  Hyperthyroidism, suppressed TSH 0.01, family history of Graves disease, weight loss for 6 months   EXAM: THYROID  SCAN AND UPTAKE - 4 AND 24 HOURS   TECHNIQUE: Following oral administration of I-123 capsule, anterior planar imaging was acquired at 24 hours. Thyroid  uptake was calculated with a thyroid  probe at 4-6 hours and 24 hours.   RADIOPHARMACEUTICALS:  434 uCi I-123 sodium iodide p.o.   COMPARISON:  None   Correlation: Thyroid  ultrasound 06/08/2021   FINDINGS: Homogeneous tracer distribution throughout both thyroid  lobes.   No focal areas of increased or decreased tracer localization seen.   4 hour I-123 uptake = 26.2% (normal 5-20%)   24 hour I-123 uptake = 51.0% (normal 10-30%)   IMPRESSION: Normal thyroid  scan with elevated 4 hour and 24 hour radio iodine uptakes consistent with Graves disease.     Electronically Signed   By:  Oneil Kiss M.D.   On: 07/14/2021 17:12    Latest Reference Range & Units 01/24/23 11:19 01/24/23 11:20 04/26/23 09:49 07/26/23 11:03 11/01/23 12:15 11/01/23 12:18  TSH 0.350 - 4.500 uIU/mL 0.155 (L)  0.211 (L) 2.594 9.663 (H)   T4,Free(Direct) 0.61 - 1.12 ng/dL  8.81 (H) 8.67 (H) 9.08  0.91  (  L): Data is abnormally low (H): Data is abnormally high   Assessment & Plan:   1. 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  -She had RAI ablation in October 2022.    Her previsit thyroid  function tests are consistent with slight under-replacement TSH is high and Free T4 is on lower end of normal, however she did not tolerate the 100 mcg dose previously (had palpitations).  She denies any overt symptoms of under-replacement, notes her palpitations have resolved since lowering back down, thus she is advised to continue her Levothyroxine  88 mcg po daily before breakfast.  Will recheck again in 3 months and adjust accordingly.   - The correct intake of thyroid  hormone (Levothyroxine , Synthroid ), is on empty stomach first thing in the morning, with water, separated by at least 30 minutes from breakfast and other medications,  and separated by more than 4 hours from calcium, iron, multivitamins, acid reflux medications (PPIs).  - This medication is a life-long medication and will be needed to correct thyroid  hormone imbalances for the rest of your life.  The dose may change from time to time, based on thyroid  blood work.  - It is extremely important to be consistent taking this medication, near the same time each morning.  -AVOID TAKING PRODUCTS CONTAINING BIOTIN (commonly found in Hair, Skin, Nails vitamins) AS IT INTERFERES WITH THE VALIDITY OF THYROID  FUNCTION BLOOD TESTS.     -Patient is advised to maintain close follow up with Zhou-Talbert, Serena S, MD for primary care needs.    I spent  21  minutes in  the care of the patient today including review of labs from Thyroid  Function, CMP, and other relevant labs ; imaging/biopsy records (current and previous including abstractions from other facilities); face-to-face time discussing  her lab results and symptoms, medications doses, her options of short and long term treatment based on the latest standards of care / guidelines;   and documenting the encounter.  Monica Cisneros  participated in the discussions, expressed understanding, and voiced agreement with the above plans.  All questions were answered to her satisfaction. she is encouraged to contact clinic should she have any questions or concerns prior to her return visit.  Follow up plan: Return in about 3 months (around 02/12/2024) for Thyroid  follow up, Previsit labs.   Thank you for involving me in the care of this pleasant patient, and I will continue to update you with her progress.   Benton Rio, [facility] [address] [place], [place] [address] Phone: [phone] Fax: [phone]  11/15/2023, 10:39 AM

## 2024-02-12 ENCOUNTER — Other Ambulatory Visit (HOSPITAL_COMMUNITY)
Admission: RE | Admit: 2024-02-12 | Discharge: 2024-02-12 | Disposition: A | Discharge status: Home / Self Care | Source: Ambulatory Visit | Attending: Nurse Practitioner | Admitting: Nurse Practitioner

## 2024-02-12 DIAGNOSIS — E890 Postprocedural hypothyroidism: Secondary | ICD-10-CM | POA: Insufficient documentation

## 2024-02-12 LAB — TSH: TSH: 5.834 u[IU]/mL — ABNORMAL HIGH (ref 0.350–4.500)

## 2024-02-12 LAB — T4, FREE: Free T4: 0.85 ng/dL (ref 0.61–1.12)

## 2024-02-15 ENCOUNTER — Ambulatory Visit: Admitting: Nurse Practitioner

## 2024-02-15 ENCOUNTER — Encounter: Payer: Self-pay | Admitting: Nurse Practitioner

## 2024-02-15 VITALS — BP 110/70 | HR 69 | Ht 65.25 in | Wt 129.4 lb

## 2024-02-15 DIAGNOSIS — E890 Postprocedural hypothyroidism: Secondary | ICD-10-CM | POA: Diagnosis not present

## 2024-02-15 MED ORDER — LEVOTHYROXINE SODIUM 88 MCG PO TABS: 88.0000 ug | ORAL_TABLET | Freq: Every day | ORAL | 1 refills | Status: DC

## 2024-02-15 NOTE — Progress Notes (Signed)
 02/15/2024     Endocrinology Follow Up Note    Subjective:    Patient ID: Monica Cisneros, female    DOB: Feb 19, 1961, PCP Zhou-Talbert, Foy Imam, MD.   Past Medical History:  Diagnosis Date   Diabetes mellitus, type II (HCC)    Hyperthyroidism     History reviewed. No pertinent surgical history.  Social History   Socioeconomic History   Marital status: Married    Spouse name: Not on file   Number of children: Not on file   Years of education: Not on file   Highest education level: Not on file  Occupational History   Not on file  Tobacco Use   Smoking status: Every Day    Current packs/day: 1.00    Types: Cigarettes   Smokeless tobacco: Never  Vaping Use   Vaping status: Never Used  Substance and Sexual Activity   Alcohol use: Not Currently   Drug use: No   Sexual activity: Not on file  Other Topics Concern   Not on file  Social History Narrative   Not on file   Social Drivers of Health   Financial Resource Strain: Not on file  Food Insecurity: Not on file  Transportation Needs: Not on file  Physical Activity: Not on file  Stress: Not on file  Social Connections: Not on file    History reviewed. No pertinent family history.  Outpatient Encounter Medications as of 02/15/2024  Medication Sig   Ascorbic Acid (VITAMIN C) 1000 MG tablet    aspirin  81 MG chewable tablet Chew 81 mg by mouth daily.   atorvastatin (LIPITOR) 40 MG tablet Take 40 mg by mouth daily.   cholecalciferol (VITAMIN D3) 25 MCG (1000 UNIT) tablet    cyclobenzaprine (FLEXERIL) 10 MG tablet Take 10 mg by mouth 3 (three) times daily.   escitalopram (LEXAPRO) 10 MG tablet Take 10 mg by mouth daily.   furosemide (LASIX) 20 MG tablet Take 20 mg by mouth daily.   hydrOXYzine (ATARAX/VISTARIL) 25 MG tablet 1 tablet as needed   LANTUS SOLOSTAR 100 UNIT/ML Solostar Pen Inject 13 Units into the skin at bedtime.   lisinopril (ZESTRIL) 10 MG tablet Take 10 mg by mouth daily.   metFORMIN  (GLUCOPHAGE) 500 MG tablet Take 500 mg by mouth 2 (two) times daily.   metoprolol tartrate (LOPRESSOR) 25 MG tablet Take 25 mg by mouth 2 (two) times daily.   naproxen (NAPROSYN) 500 MG tablet Take 500 mg by mouth 2 (two) times daily. For neck   Omega-3 Fatty Acids (FISH OIL) 1000 MG CAPS 1 capsule   [DISCONTINUED] levothyroxine  (SYNTHROID ) 88 MCG tablet Take 1 tablet (88 mcg total) by mouth daily before breakfast.   levothyroxine  (SYNTHROID ) 88 MCG tablet Take 1 tablet (88 mcg total) by mouth daily before breakfast.   No facility-administered encounter medications on file as of 02/15/2024.    ALLERGIES: No Known Allergies  VACCINATION STATUS:  There is no immunization history on file for this patient.   HPI  Monica Cisneros is 63 y.o. female who presents today with a medical history as above. she is being seen in follow up after being seen in consultation for hyperthyroidism requested by Zhou-Talbert, Foy Imam, MD.  she has been dealing with symptoms of irritability, unintentional weight loss, tremors, and insomnia for about 6 months.   she denies dysphagia, choking, shortness of breath, no recent voice change.    she does have extensive family history of thyroid  dysfunction in her daughters,  siblings, and parents, but denies family hx of thyroid  cancer. she denies personal history of goiter. she is not on any anti-thyroid  medications nor on any thyroid  hormone supplements. Denies use of Biotin containing supplements.   She had RAI ablation for hyperthyroidism in October 2022, now on thyroid  hormone replacement.  Review of systems  Constitutional: + Minimally fluctuating body weight,  current Body mass index is 21.37 kg/m. , no fatigue, no subjective hyperthermia, no subjective hypothermia Eyes: no blurry vision, no xerophthalmia ENT: no sore throat, no nodules palpated in throat, no dysphagia/odynophagia, no hoarseness Cardiovascular: no chest pain, no shortness of breath, no  palpitations, no leg swelling Respiratory: no cough, no shortness of breath Gastrointestinal: no nausea/vomiting/diarrhea Musculoskeletal: no muscle/joint aches Skin: no rashes, no hyperemia Neurological: no tremors, no numbness, no tingling, no dizziness Psychiatric: no depression, no anxiety   Objective:    BP 110/70 (BP Location: Left Arm, Patient Position: Sitting, Cuff Size: Large)   Pulse 69   Ht 5' 5.25" (1.657 m)   Wt 129 lb 6.4 oz (58.7 kg)   LMP  (LMP Unknown)   BMI 21.37 kg/m   Wt Readings from Last 3 Encounters:  02/15/24 129 lb 6.4 oz (58.7 kg)  11/15/23 130 lb 9.6 oz (59.2 kg)  08/04/23 127 lb (57.6 kg)     BP Readings from Last 3 Encounters:  02/15/24 110/70  11/15/23 (!) 140/72  08/04/23 116/76                         Physical Exam- Limited  Constitutional:  Body mass index is 21.37 kg/m. , not in acute distress, normal state of mind Eyes:  EOMI, no exophthalmos Cardiovascular: RRR, no murmurs, rubs, or gallops, no edema Musculoskeletal: no gross deformities, strength intact in all four extremities, no gross restriction of joint movements Skin:  no rashes, no hyperemia Neurological: no tremor with outstretched hands   CMP     Component Value Date/Time   NA 139 09/23/2014 2114   K 3.7 09/23/2014 2114   CL 101 09/23/2014 2114   CO2 22 09/23/2014 2114   GLUCOSE 168 (H) 09/23/2014 2114   BUN 10 09/23/2014 2114   CREATININE 0.79 09/23/2014 2114   CALCIUM 9.5 09/23/2014 2114   GFRNONAA >90 09/23/2014 2114   GFRAA >90 09/23/2014 2114     CBC    Component Value Date/Time   WBC 14.9 (H) 09/23/2014 2114   RBC 4.63 09/23/2014 2114   HGB 13.8 09/23/2014 2114   HCT 41.3 09/23/2014 2114   PLT 255 09/23/2014 2114   MCV 89.2 09/23/2014 2114   MCH 29.8 09/23/2014 2114   MCHC 33.4 09/23/2014 2114   RDW 13.1 09/23/2014 2114   LYMPHSABS 2.7 09/23/2014 2114   MONOABS 1.1 (H) 09/23/2014 2114   EOSABS 0.2 09/23/2014 2114   BASOSABS 0.0 09/23/2014 2114      Diabetic Labs (most recent): No results found for: "HGBA1C", "MICROALBUR"  Lipid Panel  No results found for: "CHOL", "TRIG", "HDL", "CHOLHDL", "VLDL", "LDLCALC", "LDLDIRECT", "LABVLDL"   Lab Results  Component Value Date   TSH 5.834 (H) 02/12/2024   TSH 9.663 (H) 11/01/2023   TSH 2.594 07/26/2023   TSH 0.211 (L) 04/26/2023   TSH 0.155 (L) 01/24/2023   TSH 114.291 (H) 10/25/2022   TSH <0.010 (L) 07/25/2022   TSH 1.511 04/15/2022   TSH 8.586 (H) 01/17/2022   TSH 38.296 (H) 11/12/2021   FREET4 0.85 02/12/2024   FREET4 0.91 11/01/2023  FREET4 0.91 07/26/2023   FREET4 1.32 (H) 04/26/2023   FREET4 1.18 (H) 01/24/2023   FREET4 0.43 (L) 10/25/2022   FREET4 1.35 (H) 07/25/2022   FREET4 0.94 04/15/2022   FREET4 0.82 01/17/2022   FREET4 0.57 (L) 11/12/2021     Thyroid  Us  from 06/08/2021 CLINICAL DATA:  Hyperthyroid.   EXAM: THYROID  ULTRASOUND   TECHNIQUE: Ultrasound examination of the thyroid  gland and adjacent soft tissues was performed.   COMPARISON:  None.   FINDINGS: Parenchymal Echotexture: Moderately heterogenous   Isthmus: 0.2 cm   Right lobe: 4.6 x 2.0 x 1.4 cm   Left lobe: 4.2 x 1.7 x 1.4 cm   _________________________________________________________   Estimated total number of nodules >/= 1 cm: 1   Number of spongiform nodules >/=  2 cm not described below (TR1): 0   Number of mixed cystic and solid nodules >/= 1.5 cm not described below (TR2): 0   _________________________________________________________   Nodule # 2:   Location: Left; Superior   Maximum size: 1.7 cm; Other 2 dimensions: 1.4 x 1.1 cm   Composition: solid/almost completely solid (2)   Echogenicity: hypoechoic (2)   Shape: not taller-than-wide (0)   Margins: smooth (0)   Echogenic foci: none (0)   ACR TI-RADS total points: 4.   ACR TI-RADS risk category: TR4 (4-6 points).   ACR TI-RADS recommendations:   **Given size (>/= 1.5 cm) and appearance, fine needle  aspiration of this moderately suspicious nodule should be considered based on TI-RADS criteria.   _________________________________________________________   Additional small subcentimeter thyroid  nodules bilaterally which do not meet criteria to warrant further evaluation.   IMPRESSION: Approximately 1.7 cm TI-RADS category 4 nodule in the left superior gland meets criteria to consider fine-needle aspiration biopsy.   The background thyroid  gland is moderately heterogeneous in appearance.   The above is in keeping with the ACR TI-RADS recommendations - J Am Coll Radiol 2017;14:587-595.     Electronically Signed   By: Fernando Hoyer M.D.   On: 06/08/2021 11:46 -----------------------------------------------------------------------------------------------------------------------------  Uptake and Scan from 07/14/21 CLINICAL DATA:  Hyperthyroidism, suppressed TSH 0.01, family history of Graves disease, weight loss for 6 months   EXAM: THYROID  SCAN AND UPTAKE - 4 AND 24 HOURS   TECHNIQUE: Following oral administration of I-123 capsule, anterior planar imaging was acquired at 24 hours. Thyroid  uptake was calculated with a thyroid  probe at 4-6 hours and 24 hours.   RADIOPHARMACEUTICALS:  434 uCi I-123 sodium iodide p.o.   COMPARISON:  None   Correlation: Thyroid  ultrasound 06/08/2021   FINDINGS: Homogeneous tracer distribution throughout both thyroid  lobes.   No focal areas of increased or decreased tracer localization seen.   4 hour I-123 uptake = 26.2% (normal 5-20%)   24 hour I-123 uptake = 51.0% (normal 10-30%)   IMPRESSION: Normal thyroid  scan with elevated 4 hour and 24 hour radio iodine uptakes consistent with Graves disease.     Electronically Signed   By: Wynelle Heather M.D.   On: 07/14/2021 17:12    Latest Reference Range & Units 04/26/23 09:49 07/26/23 11:03 11/01/23 12:15 11/01/23 12:18 02/12/24 09:38  TSH 0.350 - 4.500 uIU/mL 0.211 (L) 2.594 9.663  (H)  5.834 (H)  T4,Free(Direct) 0.61 - 1.12 ng/dL 8.29 (H) 5.62  1.30 8.65  (L): Data is abnormally low (H): Data is abnormally high   Assessment & Plan:   1. 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  -She had RAI ablation in October 2022.    Her previsit thyroid  function tests are consistent with slight under-replacement TSH is high and Free T4 is on lower end of normal, however she did not tolerate the 100 mcg dose previously (had palpitations).  Her weight based maximum dose would be around 93 mcg.  She also notes she has missed a dose here and there.   She denies any significant symptoms of under-replacement.  She is advised to continue her Levothyroxine  88 mcg po daily before breakfast.  Will recheck again in 4 months and adjust accordingly.  I did encourage her to be more consistent with taking her medications.   - The correct intake of thyroid  hormone (Levothyroxine , Synthroid ), is on empty stomach first thing in the morning, with water, separated by at least 30 minutes from breakfast and other medications,  and separated by more than 4 hours from calcium, iron, multivitamins, acid reflux medications (PPIs).  - This medication is a life-long medication and will be needed to correct thyroid  hormone imbalances for the rest of your life.  The dose may change from time to time, based on thyroid  blood work.  - It is extremely important to be consistent taking this medication, near the same time each morning.  -AVOID TAKING PRODUCTS CONTAINING BIOTIN (commonly found in Hair, Skin, Nails vitamins) AS IT INTERFERES WITH THE VALIDITY OF THYROID  FUNCTION BLOOD TESTS.     -Patient is advised to maintain close follow up with Zhou-Talbert, Serena S, MD for primary care needs.    I spent  14  minutes in the care of the patient today including review of labs from Thyroid  Function, CMP, and other relevant  labs ; imaging/biopsy records (current and previous including abstractions from other facilities); face-to-face time discussing  her lab results and symptoms, medications doses, her options of short and long term treatment based on the latest standards of care / guidelines;   and documenting the encounter.  Taviana Oplinger  participated in the discussions, expressed understanding, and voiced agreement with the above plans.  All questions were answered to her satisfaction. she is encouraged to contact clinic should she have any questions or concerns prior to her return visit.  Follow up plan: Return in about 4 months (around 06/17/2024) for Thyroid  follow up, Previsit labs.   Thank you for involving me in the care of this pleasant patient, and I will continue to update you with her progress.   Hulon Magic, [facility] [address] [place], [place] [address] Phone: [phone] Fax: [phone]  02/15/2024, 9:35 AM

## 2024-02-15 NOTE — Patient Instructions (Signed)

## 2024-02-16 ENCOUNTER — Ambulatory Visit: Payer: Medicaid Other | Admitting: Nurse Practitioner

## 2024-02-16 DIAGNOSIS — E890 Postprocedural hypothyroidism: Secondary | ICD-10-CM

## 2024-06-12 ENCOUNTER — Other Ambulatory Visit (HOSPITAL_COMMUNITY)
Admission: RE | Admit: 2024-06-12 | Discharge: 2024-06-12 | Disposition: A | Discharge status: Home / Self Care | Source: Ambulatory Visit | Attending: Nurse Practitioner | Admitting: Nurse Practitioner

## 2024-06-12 DIAGNOSIS — E890 Postprocedural hypothyroidism: Secondary | ICD-10-CM | POA: Insufficient documentation

## 2024-06-12 LAB — T4, FREE: Free T4: 0.83 ng/dL (ref 0.61–1.12)

## 2024-06-12 LAB — TSH: TSH: 2.881 u[IU]/mL (ref 0.350–4.500)

## 2024-06-16 NOTE — Patient Instructions (Signed)

## 2024-06-17 ENCOUNTER — Encounter: Payer: Self-pay | Admitting: Nurse Practitioner

## 2024-06-17 ENCOUNTER — Ambulatory Visit: Admitting: Nurse Practitioner

## 2024-06-17 VITALS — BP 118/74 | HR 62 | Ht 65.25 in | Wt 130.8 lb

## 2024-06-17 DIAGNOSIS — E890 Postprocedural hypothyroidism: Secondary | ICD-10-CM

## 2024-06-17 NOTE — Progress Notes (Signed)
 06/17/2024     Endocrinology Follow Up Note    Subjective:    Patient ID: Monica Cisneros, female    DOB: 1961/04/08, PCP Zhou-Talbert, Stephens RAMAN, MD.   Past Medical History:  Diagnosis Date   Diabetes mellitus, type II (HCC)    Hyperthyroidism     History reviewed. No pertinent surgical history.  Social History   Socioeconomic History   Marital status: Married    Spouse name: Not on file   Number of children: Not on file   Years of education: Not on file   Highest education level: Not on file  Occupational History   Not on file  Tobacco Use   Smoking status: Every Day    Current packs/day: 1.00    Types: Cigarettes   Smokeless tobacco: Never  Vaping Use   Vaping status: Never Used  Substance and Sexual Activity   Alcohol use: Not Currently   Drug use: No   Sexual activity: Not on file  Other Topics Concern   Not on file  Social History Narrative   Not on file   Social Drivers of Health   Financial Resource Strain: Not on file  Food Insecurity: Not on file  Transportation Needs: Not on file  Physical Activity: Not on file  Stress: Not on file  Social Connections: Not on file    History reviewed. No pertinent family history.  Outpatient Encounter Medications as of 06/17/2024  Medication Sig   Ascorbic Acid (VITAMIN C) 1000 MG tablet    aspirin  81 MG chewable tablet Chew 81 mg by mouth daily.   atorvastatin (LIPITOR) 40 MG tablet Take 40 mg by mouth daily.   cholecalciferol (VITAMIN D3) 25 MCG (1000 UNIT) tablet    cyclobenzaprine (FLEXERIL) 10 MG tablet Take 10 mg by mouth 3 (three) times daily.   escitalopram (LEXAPRO) 10 MG tablet Take 10 mg by mouth daily.   furosemide (LASIX) 20 MG tablet Take 20 mg by mouth daily.   hydrOXYzine (ATARAX/VISTARIL) 25 MG tablet 1 tablet as needed (Patient taking differently: as needed.)   LANTUS SOLOSTAR 100 UNIT/ML Solostar Pen Inject 13 Units into the skin at bedtime.   levothyroxine  (SYNTHROID ) 88 MCG tablet  Take 1 tablet (88 mcg total) by mouth daily before breakfast.   lisinopril (ZESTRIL) 10 MG tablet Take 10 mg by mouth daily.   metFORMIN (GLUCOPHAGE) 500 MG tablet Take 500 mg by mouth 2 (two) times daily.   metoprolol tartrate (LOPRESSOR) 25 MG tablet Take 25 mg by mouth 2 (two) times daily.   naproxen (NAPROSYN) 500 MG tablet Take 500 mg by mouth 2 (two) times daily. For neck   Omega-3 Fatty Acids (FISH OIL) 1000 MG CAPS 1 capsule   No facility-administered encounter medications on file as of 06/17/2024.    ALLERGIES: No Known Allergies  VACCINATION STATUS:  There is no immunization history on file for this patient.   HPI  Monica Cisneros is 63 y.o. female who presents today with a medical history as above. she is being seen in follow up after being seen in consultation for hyperthyroidism requested by Zhou-Talbert, Stephens RAMAN, MD.  she has been dealing with symptoms of irritability, unintentional weight loss, tremors, and insomnia for about 6 months.   she denies dysphagia, choking, shortness of breath, no recent voice change.    she does have extensive family history of thyroid  dysfunction in her daughters, siblings, and parents, but denies family hx of thyroid  cancer. she denies personal history  of goiter. she is not on any anti-thyroid  medications nor on any thyroid  hormone supplements. Denies use of Biotin containing supplements.   She had RAI ablation for hyperthyroidism in October 2022, now on thyroid  hormone replacement.  Review of systems  Constitutional: + Minimally fluctuating body weight,  current Body mass index is 21.6 kg/m. , no fatigue, no subjective hyperthermia, no subjective hypothermia Eyes: no blurry vision, no xerophthalmia ENT: no sore throat, no nodules palpated in throat, no dysphagia/odynophagia, no hoarseness Cardiovascular: no chest pain, no shortness of breath, no palpitations, no leg swelling Respiratory: no cough, no shortness of breath Gastrointestinal:  no nausea/vomiting/diarrhea Musculoskeletal: no muscle/joint aches Skin: no rashes, no hyperemia Neurological: no tremors, no numbness, no tingling, no dizziness Psychiatric: no depression, no anxiety   Objective:    BP 118/74 (BP Location: Right Arm, Patient Position: Sitting, Cuff Size: Large)   Pulse 62   Ht 5' 5.25 (1.657 m)   Wt 130 lb 12.8 oz (59.3 kg)   LMP  (LMP Unknown)   BMI 21.60 kg/m   Wt Readings from Last 3 Encounters:  06/17/24 130 lb 12.8 oz (59.3 kg)  02/15/24 129 lb 6.4 oz (58.7 kg)  11/15/23 130 lb 9.6 oz (59.2 kg)     BP Readings from Last 3 Encounters:  06/17/24 118/74  02/15/24 110/70  11/15/23 (!) 140/72                         Physical Exam- Limited  Constitutional:  Body mass index is 21.6 kg/m. , not in acute distress, normal state of mind Eyes:  EOMI, no exophthalmos Musculoskeletal: no gross deformities, strength intact in all four extremities, no gross restriction of joint movements Skin:  no rashes, no hyperemia Neurological: no tremor with outstretched hands   CMP     Component Value Date/Time   NA 139 09/23/2014 2114   K 3.7 09/23/2014 2114   CL 101 09/23/2014 2114   CO2 22 09/23/2014 2114   GLUCOSE 168 (H) 09/23/2014 2114   BUN 10 09/23/2014 2114   CREATININE 0.79 09/23/2014 2114   CALCIUM 9.5 09/23/2014 2114   GFRNONAA >90 09/23/2014 2114   GFRAA >90 09/23/2014 2114     CBC    Component Value Date/Time   WBC 14.9 (H) 09/23/2014 2114   RBC 4.63 09/23/2014 2114   HGB 13.8 09/23/2014 2114   HCT 41.3 09/23/2014 2114   PLT 255 09/23/2014 2114   MCV 89.2 09/23/2014 2114   MCH 29.8 09/23/2014 2114   MCHC 33.4 09/23/2014 2114   RDW 13.1 09/23/2014 2114   LYMPHSABS 2.7 09/23/2014 2114   MONOABS 1.1 (H) 09/23/2014 2114   EOSABS 0.2 09/23/2014 2114   BASOSABS 0.0 09/23/2014 2114     Diabetic Labs (most recent): No results found for: HGBA1C, MICROALBUR  Lipid Panel  No results found for: CHOL, TRIG, HDL,  CHOLHDL, VLDL, LDLCALC, LDLDIRECT, LABVLDL   Lab Results  Component Value Date   TSH 2.881 06/12/2024   TSH 5.834 (H) 02/12/2024   TSH 9.663 (H) 11/01/2023   TSH 2.594 07/26/2023   TSH 0.211 (L) 04/26/2023   TSH 0.155 (L) 01/24/2023   TSH 114.291 (H) 10/25/2022   TSH <0.010 (L) 07/25/2022   TSH 1.511 04/15/2022   TSH 8.586 (H) 01/17/2022   FREET4 0.83 06/12/2024   FREET4 0.85 02/12/2024   FREET4 0.91 11/01/2023   FREET4 0.91 07/26/2023   FREET4 1.32 (H) 04/26/2023   FREET4 1.18 (H) 01/24/2023  FREET4 0.43 (L) 10/25/2022   FREET4 1.35 (H) 07/25/2022   FREET4 0.94 04/15/2022   FREET4 0.82 01/17/2022     Thyroid  Us  from 06/08/2021 CLINICAL DATA:  Hyperthyroid.   EXAM: THYROID  ULTRASOUND   TECHNIQUE: Ultrasound examination of the thyroid  gland and adjacent soft tissues was performed.   COMPARISON:  None.   FINDINGS: Parenchymal Echotexture: Moderately heterogenous   Isthmus: 0.2 cm   Right lobe: 4.6 x 2.0 x 1.4 cm   Left lobe: 4.2 x 1.7 x 1.4 cm   _________________________________________________________   Estimated total number of nodules >/= 1 cm: 1   Number of spongiform nodules >/=  2 cm not described below (TR1): 0   Number of mixed cystic and solid nodules >/= 1.5 cm not described below (TR2): 0   _________________________________________________________   Nodule # 2:   Location: Left; Superior   Maximum size: 1.7 cm; Other 2 dimensions: 1.4 x 1.1 cm   Composition: solid/almost completely solid (2)   Echogenicity: hypoechoic (2)   Shape: not taller-than-wide (0)   Margins: smooth (0)   Echogenic foci: none (0)   ACR TI-RADS total points: 4.   ACR TI-RADS risk category: TR4 (4-6 points).   ACR TI-RADS recommendations:   **Given size (>/= 1.5 cm) and appearance, fine needle aspiration of this moderately suspicious nodule should be considered based on TI-RADS criteria.    _________________________________________________________   Additional small subcentimeter thyroid  nodules bilaterally which do not meet criteria to warrant further evaluation.   IMPRESSION: Approximately 1.7 cm TI-RADS category 4 nodule in the left superior gland meets criteria to consider fine-needle aspiration biopsy.   The background thyroid  gland is moderately heterogeneous in appearance.   The above is in keeping with the ACR TI-RADS recommendations - J Am Coll Radiol 2017;14:587-595.     Electronically Signed   By: Wilkie Lent M.D.   On: 06/08/2021 11:46 -----------------------------------------------------------------------------------------------------------------------------  Uptake and Scan from 07/14/21 CLINICAL DATA:  Hyperthyroidism, suppressed TSH 0.01, family history of Graves disease, weight loss for 6 months   EXAM: THYROID  SCAN AND UPTAKE - 4 AND 24 HOURS   TECHNIQUE: Following oral administration of I-123 capsule, anterior planar imaging was acquired at 24 hours. Thyroid  uptake was calculated with a thyroid  probe at 4-6 hours and 24 hours.   RADIOPHARMACEUTICALS:  434 uCi I-123 sodium iodide p.o.   COMPARISON:  None   Correlation: Thyroid  ultrasound 06/08/2021   FINDINGS: Homogeneous tracer distribution throughout both thyroid  lobes.   No focal areas of increased or decreased tracer localization seen.   4 hour I-123 uptake = 26.2% (normal 5-20%)   24 hour I-123 uptake = 51.0% (normal 10-30%)   IMPRESSION: Normal thyroid  scan with elevated 4 hour and 24 hour radio iodine uptakes consistent with Graves disease.     Electronically Signed   By: Oneil Kiss M.D.   On: 07/14/2021 17:12    Latest Reference Range & Units 04/26/23 09:49 07/26/23 11:03 11/01/23 12:15 11/01/23 12:18 02/12/24 09:38 06/12/24 15:55 06/12/24 15:56  TSH 0.350 - 4.500 uIU/mL 0.211 (L) 2.594 9.663 (H)  5.834 (H) 2.881   T4,Free(Direct) 0.61 - 1.12 ng/dL 8.67 (H)  9.08  9.08 9.14  0.83  (L): Data is abnormally low (H): Data is abnormally high   Assessment & Plan:   1. 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  -She had RAI ablation in October 2022.    Her previsit  TFTs are consistent with appropriate hormone replacement.  She is advised to continue Levothyroxine  88 mcg po daily before breakfast.  She tried higher dose in the past and did not tolerate well due to palpitations.  Will recheck TFTs prior to next visit and adjust dose accordingly.  If labs stable, may push out follow up appts.   - The correct intake of thyroid  hormone (Levothyroxine , Synthroid ), is on empty stomach first thing in the morning, with water, separated by at least 30 minutes from breakfast and other medications,  and separated by more than 4 hours from calcium, iron, multivitamins, acid reflux medications (PPIs).  - This medication is a life-long medication and will be needed to correct thyroid  hormone imbalances for the rest of your life.  The dose may change from time to time, based on thyroid  blood work.  - It is extremely important to be consistent taking this medication, near the same time each morning.  -AVOID TAKING PRODUCTS CONTAINING BIOTIN (commonly found in Hair, Skin, Nails vitamins) AS IT INTERFERES WITH THE VALIDITY OF THYROID  FUNCTION BLOOD TESTS.     -Patient is advised to maintain close follow up with Zhou-Talbert, Serena S, MD for primary care needs.    I spent  13  minutes in the care of the patient today including review of labs from Thyroid  Function, CMP, and other relevant labs ; imaging/biopsy records (current and previous including abstractions from other facilities); face-to-face time discussing  her lab results and symptoms, medications doses, her options of short and long term treatment based on the latest standards of care / guidelines;   and documenting  the encounter.  Crystalyn Farooq  participated in the discussions, expressed understanding, and voiced agreement with the above plans.  All questions were answered to her satisfaction. she is encouraged to contact clinic should she have any questions or concerns prior to her return visit.  Follow up plan: Return in about 4 months (around 10/17/2024) for Thyroid  follow up, Previsit labs.   Thank you for involving me in the care of this pleasant patient, and I will continue to update you with her progress.   Benton Rio, [facility] [address] [place], [place] [address] Phone: [phone] Fax: [phone]  06/17/2024, 9:11 AM

## 2024-06-17 NOTE — Progress Notes (Signed)
 06/17/2024     Endocrinology Follow Up Note    Subjective:    Patient ID: Monica Cisneros, female    DOB: 1961-07-25, PCP Zhou-Talbert, Stephens RAMAN, MD.   Past Medical History:  Diagnosis Date   Diabetes mellitus, type II (HCC)    Hyperthyroidism     No past surgical history on file.  Social History   Socioeconomic History   Marital status: Married    Spouse name: Not on file   Number of children: Not on file   Years of education: Not on file   Highest education level: Not on file  Occupational History   Not on file  Tobacco Use   Smoking status: Every Day    Current packs/day: 1.00    Types: Cigarettes   Smokeless tobacco: Never  Vaping Use   Vaping status: Never Used  Substance and Sexual Activity   Alcohol use: Not Currently   Drug use: No   Sexual activity: Not on file  Other Topics Concern   Not on file  Social History Narrative   Not on file   Social Drivers of Health   Financial Resource Strain: Not on file  Food Insecurity: Not on file  Transportation Needs: Not on file  Physical Activity: Not on file  Stress: Not on file  Social Connections: Not on file    No family history on file.  Outpatient Encounter Medications as of 06/17/2024  Medication Sig   Ascorbic Acid (VITAMIN C) 1000 MG tablet    aspirin  81 MG chewable tablet Chew 81 mg by mouth daily.   atorvastatin (LIPITOR) 40 MG tablet Take 40 mg by mouth daily.   cholecalciferol (VITAMIN D3) 25 MCG (1000 UNIT) tablet    cyclobenzaprine (FLEXERIL) 10 MG tablet Take 10 mg by mouth 3 (three) times daily.   escitalopram (LEXAPRO) 10 MG tablet Take 10 mg by mouth daily.   furosemide (LASIX) 20 MG tablet Take 20 mg by mouth daily.   hydrOXYzine (ATARAX/VISTARIL) 25 MG tablet 1 tablet as needed (Patient taking differently: as needed.)   LANTUS SOLOSTAR 100 UNIT/ML Solostar Pen Inject 13 Units into the skin at bedtime.   levothyroxine  (SYNTHROID ) 88 MCG tablet Take 1 tablet (88 mcg total) by  mouth daily before breakfast.   lisinopril (ZESTRIL) 10 MG tablet Take 10 mg by mouth daily.   metFORMIN (GLUCOPHAGE) 500 MG tablet Take 500 mg by mouth 2 (two) times daily.   metoprolol tartrate (LOPRESSOR) 25 MG tablet Take 25 mg by mouth 2 (two) times daily.   naproxen (NAPROSYN) 500 MG tablet Take 500 mg by mouth 2 (two) times daily. For neck   Omega-3 Fatty Acids (FISH OIL) 1000 MG CAPS 1 capsule   No facility-administered encounter medications on file as of 06/17/2024.    ALLERGIES: No Known Allergies  VACCINATION STATUS:  There is no immunization history on file for this patient.   HPI  [name] is 63 y.o. female who presents today with a medical history as above. she is being seen in follow up after being seen in consultation for hyperthyroidism requested by Zhou-Talbert, Stephens RAMAN, MD.  she has been dealing with symptoms of irritability, unintentional weight loss, tremors, and insomnia for about 6 months.   she denies dysphagia, choking, shortness of breath, no recent voice change.    she does have extensive family history of thyroid  dysfunction in her daughters, siblings, and parents, but denies family hx of thyroid  cancer. she denies personal history of  goiter. she is not on any anti-thyroid  medications nor on any thyroid  hormone supplements. Denies use of Biotin containing supplements.   She had RAI ablation for hyperthyroidism in October 2022, now on thyroid  hormone replacement.  Review of systems  Constitutional: + Minimally fluctuating body weight,  current Body mass index is 21.6 kg/m. , no fatigue, no subjective hyperthermia, no subjective hypothermia Eyes: no blurry vision, no xerophthalmia ENT: no sore throat, no nodules palpated in throat, no dysphagia/odynophagia, no hoarseness Cardiovascular: no chest pain, no shortness of breath, no palpitations, no leg swelling Respiratory: no cough, no shortness of breath Gastrointestinal: no  nausea/vomiting/diarrhea Musculoskeletal: no muscle/joint aches Skin: no rashes, no hyperemia Neurological: no tremors, no numbness, no tingling, no dizziness Psychiatric: no depression, no anxiety   Objective:    BP 118/74 (BP Location: Right Arm, Patient Position: Sitting, Cuff Size: Large)   Pulse 62   Ht 5' 5.25 (1.657 m)   Wt 130 lb 12.8 oz (59.3 kg)   LMP  (LMP Unknown)   BMI 21.60 kg/m   Wt Readings from Last 3 Encounters:  06/17/24 130 lb 12.8 oz (59.3 kg)  02/15/24 129 lb 6.4 oz (58.7 kg)  11/15/23 130 lb 9.6 oz (59.2 kg)     BP Readings from Last 3 Encounters:  06/17/24 118/74  02/15/24 110/70  11/15/23 (!) 140/72                         Physical Exam- Limited  Constitutional:  Body mass index is 21.6 kg/m. , not in acute distress, normal state of mind Eyes:  EOMI, no exophthalmos Cardiovascular: RRR, no murmurs, rubs, or gallops, no edema Musculoskeletal: no gross deformities, strength intact in all four extremities, no gross restriction of joint movements Skin:  no rashes, no hyperemia Neurological: no tremor with outstretched hands   CMP     Component Value Date/Time   NA 139 09/23/2014 2114   K 3.7 09/23/2014 2114   CL 101 09/23/2014 2114   CO2 22 09/23/2014 2114   GLUCOSE 168 (H) 09/23/2014 2114   BUN 10 09/23/2014 2114   CREATININE 0.79 09/23/2014 2114   CALCIUM 9.5 09/23/2014 2114   GFRNONAA >90 09/23/2014 2114   GFRAA >90 09/23/2014 2114     CBC    Component Value Date/Time   WBC 14.9 (H) 09/23/2014 2114   RBC 4.63 09/23/2014 2114   HGB 13.8 09/23/2014 2114   HCT 41.3 09/23/2014 2114   PLT 255 09/23/2014 2114   MCV 89.2 09/23/2014 2114   MCH 29.8 09/23/2014 2114   MCHC 33.4 09/23/2014 2114   RDW 13.1 09/23/2014 2114   LYMPHSABS 2.7 09/23/2014 2114   MONOABS 1.1 (H) 09/23/2014 2114   EOSABS 0.2 09/23/2014 2114   BASOSABS 0.0 09/23/2014 2114     Diabetic Labs (most recent): No results found for: HGBA1C,  MICROALBUR  Lipid Panel  No results found for: CHOL, TRIG, HDL, CHOLHDL, VLDL, LDLCALC, LDLDIRECT, LABVLDL   Lab Results  Component Value Date   TSH 2.881 06/12/2024   TSH 5.834 (H) 02/12/2024   TSH 9.663 (H) 11/01/2023   TSH 2.594 07/26/2023   TSH 0.211 (L) 04/26/2023   TSH 0.155 (L) 01/24/2023   TSH 114.291 (H) 10/25/2022   TSH <0.010 (L) 07/25/2022   TSH 1.511 04/15/2022   TSH 8.586 (H) 01/17/2022   FREET4 0.83 06/12/2024   FREET4 0.85 02/12/2024   FREET4 0.91 11/01/2023   FREET4 0.91 07/26/2023   FREET4 1.32 (  H) 04/26/2023   FREET4 1.18 (H) 01/24/2023   FREET4 0.43 (L) 10/25/2022   FREET4 1.35 (H) 07/25/2022   FREET4 0.94 04/15/2022   FREET4 0.82 01/17/2022     Thyroid  Us  from 06/08/2021 CLINICAL DATA:  Hyperthyroid.   EXAM: THYROID  ULTRASOUND   TECHNIQUE: Ultrasound examination of the thyroid  gland and adjacent soft tissues was performed.   COMPARISON:  None.   FINDINGS: Parenchymal Echotexture: Moderately heterogenous   Isthmus: 0.2 cm   Right lobe: 4.6 x 2.0 x 1.4 cm   Left lobe: 4.2 x 1.7 x 1.4 cm   _________________________________________________________   Estimated total number of nodules >/= 1 cm: 1   Number of spongiform nodules >/=  2 cm not described below (TR1): 0   Number of mixed cystic and solid nodules >/= 1.5 cm not described below (TR2): 0   _________________________________________________________   Nodule # 2:   Location: Left; Superior   Maximum size: 1.7 cm; Other 2 dimensions: 1.4 x 1.1 cm   Composition: solid/almost completely solid (2)   Echogenicity: hypoechoic (2)   Shape: not taller-than-wide (0)   Margins: smooth (0)   Echogenic foci: none (0)   ACR TI-RADS total points: 4.   ACR TI-RADS risk category: TR4 (4-6 points).   ACR TI-RADS recommendations:   **Given size (>/= 1.5 cm) and appearance, fine needle aspiration of this moderately suspicious nodule should be considered based  on TI-RADS criteria.   _________________________________________________________   Additional small subcentimeter thyroid  nodules bilaterally which do not meet criteria to warrant further evaluation.   IMPRESSION: Approximately 1.7 cm TI-RADS category 4 nodule in the left superior gland meets criteria to consider fine-needle aspiration biopsy.   The background thyroid  gland is moderately heterogeneous in appearance.   The above is in keeping with the ACR TI-RADS recommendations - J Am Coll Radiol 2017;14:587-595.     Electronically Signed   By: Wilkie Lent M.D.   On: 06/08/2021 11:46 -----------------------------------------------------------------------------------------------------------------------------  Uptake and Scan from 07/14/21 CLINICAL DATA:  Hyperthyroidism, suppressed TSH 0.01, family history of Graves disease, weight loss for 6 months   EXAM: THYROID  SCAN AND UPTAKE - 4 AND 24 HOURS   TECHNIQUE: Following oral administration of I-123 capsule, anterior planar imaging was acquired at 24 hours. Thyroid  uptake was calculated with a thyroid  probe at 4-6 hours and 24 hours.   RADIOPHARMACEUTICALS:  434 uCi I-123 sodium iodide p.o.   COMPARISON:  None   Correlation: Thyroid  ultrasound 06/08/2021   FINDINGS: Homogeneous tracer distribution throughout both thyroid  lobes.   No focal areas of increased or decreased tracer localization seen.   4 hour I-123 uptake = 26.2% (normal 5-20%)   24 hour I-123 uptake = 51.0% (normal 10-30%)   IMPRESSION: Normal thyroid  scan with elevated 4 hour and 24 hour radio iodine uptakes consistent with Graves disease.     Electronically Signed   By: Oneil Kiss M.D.   On: 07/14/2021 17:12    Latest Reference Range & Units 04/26/23 09:49 07/26/23 11:03 11/01/23 12:15 11/01/23 12:18 02/12/24 09:38  TSH 0.350 - 4.500 uIU/mL 0.211 (L) 2.594 9.663 (H)  5.834 (H)  T4,Free(Direct) 0.61 - 1.12 ng/dL 8.67 (H) 9.08  9.08 9.14   (L): Data is abnormally low (H): Data is abnormally high   Assessment & Plan:   1. 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  -She had RAI ablation in October 2022.  Her previsit thyroid  function tests are consistent with slight under-replacement TSH is high and Free T4 is on lower end of normal, however she did not tolerate the 100 mcg dose previously (had palpitations).  Her weight based maximum dose would be around 93 mcg.  She also notes she has missed a dose here and there.   She denies any significant symptoms of under-replacement.  She is advised to continue her Levothyroxine  88 mcg po daily before breakfast.  Will recheck again in 4 months and adjust accordingly.  I did encourage her to be more consistent with taking her medications.   - The correct intake of thyroid  hormone (Levothyroxine , Synthroid ), is on empty stomach first thing in the morning, with water, separated by at least 30 minutes from breakfast and other medications,  and separated by more than 4 hours from calcium, iron, multivitamins, acid reflux medications (PPIs).  - This medication is a life-long medication and will be needed to correct thyroid  hormone imbalances for the rest of your life.  The dose may change from time to time, based on thyroid  blood work.  - It is extremely important to be consistent taking this medication, near the same time each morning.  -AVOID TAKING PRODUCTS CONTAINING BIOTIN (commonly found in Hair, Skin, Nails vitamins) AS IT INTERFERES WITH THE VALIDITY OF THYROID  FUNCTION BLOOD TESTS.     -Patient is advised to maintain close follow up with Zhou-Talbert, Serena S, MD for primary care needs.    I spent  14  minutes in the care of the patient today including review of labs from Thyroid  Function, CMP, and other relevant labs ; imaging/biopsy records (current and previous including abstractions  from other facilities); face-to-face time discussing  her lab results and symptoms, medications doses, her options of short and long term treatment based on the latest standards of care / guidelines;   and documenting the encounter.  Ananda Wesche  participated in the discussions, expressed understanding, and voiced agreement with the above plans.  All questions were answered to her satisfaction. she is encouraged to contact clinic should she have any questions or concerns prior to her return visit.  Follow up plan: No follow-ups on file.   Thank you for involving me in the care of this pleasant patient, and I will continue to update you with her progress.   Benton Rio, [facility] [address] [place], [place] [address] Phone: [phone] Fax: [phone]  06/17/2024, 9:02 AM

## 2024-10-11 ENCOUNTER — Other Ambulatory Visit (HOSPITAL_COMMUNITY)
Admission: RE | Admit: 2024-10-11 | Discharge: 2024-10-11 | Disposition: A | Discharge status: Home / Self Care | Source: Ambulatory Visit | Attending: Nurse Practitioner | Admitting: Nurse Practitioner

## 2024-10-11 DIAGNOSIS — E890 Postprocedural hypothyroidism: Secondary | ICD-10-CM | POA: Insufficient documentation

## 2024-10-11 LAB — T4, FREE: Free T4: 1.23 ng/dL (ref 0.80–2.00)

## 2024-10-11 LAB — TSH: TSH: 8.48 u[IU]/mL — ABNORMAL HIGH (ref 0.350–4.500)

## 2024-10-17 ENCOUNTER — Ambulatory Visit: Admitting: Nurse Practitioner

## 2024-10-17 ENCOUNTER — Encounter: Payer: Self-pay | Admitting: Nurse Practitioner

## 2024-10-17 VITALS — BP 140/80 | HR 70 | Ht 65.25 in | Wt 130.6 lb

## 2024-10-17 DIAGNOSIS — E890 Postprocedural hypothyroidism: Secondary | ICD-10-CM

## 2024-10-17 MED ORDER — LEVOTHYROXINE SODIUM 88 MCG PO TABS: 88.0000 ug | ORAL_TABLET | Freq: Every day | ORAL | 3 refills | Status: AC

## 2024-10-17 NOTE — Patient Instructions (Signed)

## 2024-10-17 NOTE — Progress Notes (Signed)
 "         10/17/2024     Endocrinology Follow Up Note    Subjective:    Patient ID: Monica Cisneros, female    DOB: [birth year], PCP Zhou-Talbert, Stephens RAMAN, MD.   Past Medical History:  Diagnosis Date   Diabetes mellitus, type II (HCC)    Hyperthyroidism     History reviewed. No pertinent surgical history.  Social History   Socioeconomic History   Marital status: Married    Spouse name: Not on file   Number of children: Not on file   Years of education: Not on file   Highest education level: Not on file  Occupational History   Not on file  Tobacco Use   Smoking status: Every Day    Current packs/day: 1.00    Types: Cigarettes   Smokeless tobacco: Never  Vaping Use   Vaping status: Never Used  Substance and Sexual Activity   Alcohol use: Not Currently   Drug use: No   Sexual activity: Not on file  Other Topics Concern   Not on file  Social History Narrative   Not on file   Social Drivers of Health   Tobacco Use: High Risk (10/17/2024)   Patient History    Smoking Tobacco Use: Every Day    Smokeless Tobacco Use: Never    Passive Exposure: Not on file  Financial Resource Strain: Not on file  Food Insecurity: Not on file  Transportation Needs: Not on file  Physical Activity: Not on file  Stress: Not on file  Social Connections: Not on file  Depression (EYV7-0): Not on file  Alcohol Screen: Not on file  Housing: Not on file  Utilities: Not on file  Health Literacy: Not on file    History reviewed. No pertinent family history.  Outpatient Encounter Medications as of 10/17/2024  Medication Sig   Ascorbic Acid (VITAMIN C) 1000 MG tablet    aspirin  81 MG chewable tablet Chew 81 mg by mouth daily.   atorvastatin (LIPITOR) 40 MG tablet Take 40 mg by mouth daily.   cholecalciferol (VITAMIN D3) 25 MCG (1000 UNIT) tablet    cyclobenzaprine (FLEXERIL) 10 MG tablet Take 10 mg by mouth 3 (three) times daily.   escitalopram (LEXAPRO) 10 MG tablet Take 10 mg by mouth  daily.   furosemide (LASIX) 20 MG tablet Take 20 mg by mouth daily.   hydrOXYzine (ATARAX/VISTARIL) 25 MG tablet 1 tablet as needed (Patient taking differently: as needed.)   LANTUS SOLOSTAR 100 UNIT/ML Solostar Pen Inject 13 Units into the skin at bedtime.   metFORMIN (GLUCOPHAGE) 500 MG tablet Take 500 mg by mouth 2 (two) times daily.   metoprolol tartrate (LOPRESSOR) 25 MG tablet Take 25 mg by mouth 2 (two) times daily.   Omega-3 Fatty Acids (FISH OIL) 1000 MG CAPS 1 capsule   [DISCONTINUED] levothyroxine  (SYNTHROID ) 88 MCG tablet Take 1 tablet (88 mcg total) by mouth daily before breakfast.   levothyroxine  (SYNTHROID ) 88 MCG tablet Take 1 tablet (88 mcg total) by mouth daily before breakfast.   lisinopril (ZESTRIL) 10 MG tablet Take 10 mg by mouth daily.   naproxen (NAPROSYN) 500 MG tablet Take 500 mg by mouth 2 (two) times daily. For neck   No facility-administered encounter medications on file as of 10/17/2024.    ALLERGIES: No Known Allergies  VACCINATION STATUS:  There is no immunization history on file for this patient.   HPI  Monica Cisneros is 64 y.o. female who presents today with  a medical history as above. she is being seen in follow up after being seen in consultation for hyperthyroidism requested by Zhou-Talbert, Stephens RAMAN, MD.  she has been dealing with symptoms of irritability, unintentional weight loss, tremors, and insomnia for about 6 months.   she denies dysphagia, choking, shortness of breath, no recent voice change.    she does have extensive family history of thyroid  dysfunction in her daughters, siblings, and parents, but denies family hx of thyroid  cancer. she denies personal history of goiter. she is not on any anti-thyroid  medications nor on any thyroid  hormone supplements. Denies use of Biotin containing supplements.   She had RAI ablation for hyperthyroidism in October 2022, now on thyroid  hormone replacement.  Review of systems  Constitutional: +  Minimally fluctuating body weight,  current Body mass index is 21.57 kg/m. , no fatigue, no subjective hyperthermia, no subjective hypothermia Eyes: no blurry vision, no xerophthalmia ENT: no sore throat, no nodules palpated in throat, no dysphagia/odynophagia, no hoarseness Cardiovascular: no chest pain, no shortness of breath, no palpitations, no leg swelling Respiratory: no cough, no shortness of breath Gastrointestinal: no nausea/vomiting/diarrhea Musculoskeletal: no muscle/joint aches Skin: no rashes, no hyperemia Neurological: no tremors, no numbness, no tingling, no dizziness Psychiatric: no depression, no anxiety   Objective:    BP (!) 140/80 (BP Location: Left Arm, Patient Position: Sitting, Cuff Size: Large)   Pulse 70   Ht 5' 5.25 (1.657 m)   Wt 130 lb 9.6 oz (59.2 kg)   LMP  (LMP Unknown)   BMI 21.57 kg/m   Wt Readings from Last 3 Encounters:  10/17/24 130 lb 9.6 oz (59.2 kg)  06/17/24 130 lb 12.8 oz (59.3 kg)  02/15/24 129 lb 6.4 oz (58.7 kg)     BP Readings from Last 3 Encounters:  10/17/24 (!) 140/80  06/17/24 118/74  02/15/24 110/70                         Physical Exam- Limited  Constitutional:  Body mass index is 21.57 kg/m. , not in acute distress, normal state of mind Eyes:  EOMI, no exophthalmos Musculoskeletal: no gross deformities, strength intact in all four extremities, no gross restriction of joint movements Skin:  no rashes, no hyperemia Neurological: no tremor with outstretched hands   CMP     Component Value Date/Time   NA 139 09/23/2014 2114   K 3.7 09/23/2014 2114   CL 101 09/23/2014 2114   CO2 22 09/23/2014 2114   GLUCOSE 168 (H) 09/23/2014 2114   BUN 10 09/23/2014 2114   CREATININE 0.79 09/23/2014 2114   CALCIUM 9.5 09/23/2014 2114   GFRNONAA >90 09/23/2014 2114   GFRAA >90 09/23/2014 2114     CBC    Component Value Date/Time   WBC 14.9 (H) 09/23/2014 2114   RBC 4.63 09/23/2014 2114   HGB 13.8 09/23/2014 2114   HCT  41.3 09/23/2014 2114   PLT 255 09/23/2014 2114   MCV 89.2 09/23/2014 2114   MCH 29.8 09/23/2014 2114   MCHC 33.4 09/23/2014 2114   RDW 13.1 09/23/2014 2114   LYMPHSABS 2.7 09/23/2014 2114   MONOABS 1.1 (H) 09/23/2014 2114   EOSABS 0.2 09/23/2014 2114   BASOSABS 0.0 09/23/2014 2114     Diabetic Labs (most recent): No results found for: HGBA1C, MICROALBUR  Lipid Panel  No results found for: CHOL, TRIG, HDL, CHOLHDL, VLDL, LDLCALC, LDLDIRECT, LABVLDL   Lab Results  Component Value Date  TSH 8.480 (H) 10/11/2024   TSH 2.881 06/12/2024   TSH 5.834 (H) 02/12/2024   TSH 9.663 (H) 11/01/2023   TSH 2.594 07/26/2023   TSH 0.211 (L) 04/26/2023   TSH 0.155 (L) 01/24/2023   TSH 114.291 (H) 10/25/2022   TSH <0.010 (L) 07/25/2022   TSH 1.511 04/15/2022   FREET4 1.23 10/11/2024   FREET4 0.83 06/12/2024   FREET4 0.85 02/12/2024   FREET4 0.91 11/01/2023   FREET4 0.91 07/26/2023   FREET4 1.32 (H) 04/26/2023   FREET4 1.18 (H) 01/24/2023   FREET4 0.43 (L) 10/25/2022   FREET4 1.35 (H) 07/25/2022   FREET4 0.94 04/15/2022     Thyroid  Us  from 06/08/2021 CLINICAL DATA:  Hyperthyroid.   EXAM: THYROID  ULTRASOUND   TECHNIQUE: Ultrasound examination of the thyroid  gland and adjacent soft tissues was performed.   COMPARISON:  None.   FINDINGS: Parenchymal Echotexture: Moderately heterogenous   Isthmus: 0.2 cm   Right lobe: 4.6 x 2.0 x 1.4 cm   Left lobe: 4.2 x 1.7 x 1.4 cm   _________________________________________________________   Estimated total number of nodules >/= 1 cm: 1   Number of spongiform nodules >/=  2 cm not described below (TR1): 0   Number of mixed cystic and solid nodules >/= 1.5 cm not described below (TR2): 0   _________________________________________________________   Nodule # 2:   Location: Left; Superior   Maximum size: 1.7 cm; Other 2 dimensions: 1.4 x 1.1 cm   Composition: solid/almost completely solid (2)    Echogenicity: hypoechoic (2)   Shape: not taller-than-wide (0)   Margins: smooth (0)   Echogenic foci: none (0)   ACR TI-RADS total points: 4.   ACR TI-RADS risk category: TR4 (4-6 points).   ACR TI-RADS recommendations:   **Given size (>/= 1.5 cm) and appearance, fine needle aspiration of this moderately suspicious nodule should be considered based on TI-RADS criteria.   _________________________________________________________   Additional small subcentimeter thyroid  nodules bilaterally which do not meet criteria to warrant further evaluation.   IMPRESSION: Approximately 1.7 cm TI-RADS category 4 nodule in the left superior gland meets criteria to consider fine-needle aspiration biopsy.   The background thyroid  gland is moderately heterogeneous in appearance.   The above is in keeping with the ACR TI-RADS recommendations - J Am Coll Radiol 2017;14:587-595.     Electronically Signed   By: Wilkie Lent M.D.   On: 06/08/2021 11:46 -----------------------------------------------------------------------------------------------------------------------------  Uptake and Scan from 07/14/21 CLINICAL DATA:  Hyperthyroidism, suppressed TSH 0.01, family history of Graves disease, weight loss for 6 months   EXAM: THYROID  SCAN AND UPTAKE - 4 AND 24 HOURS   TECHNIQUE: Following oral administration of I-123 capsule, anterior planar imaging was acquired at 24 hours. Thyroid  uptake was calculated with a thyroid  probe at 4-6 hours and 24 hours.   RADIOPHARMACEUTICALS:  434 uCi I-123 sodium iodide p.o.   COMPARISON:  None   Correlation: Thyroid  ultrasound 06/08/2021   FINDINGS: Homogeneous tracer distribution throughout both thyroid  lobes.   No focal areas of increased or decreased tracer localization seen.   4 hour I-123 uptake = 26.2% (normal 5-20%)   24 hour I-123 uptake = 51.0% (normal 10-30%)   IMPRESSION: Normal thyroid  scan with elevated 4 hour and 24  hour radio iodine uptakes consistent with Graves disease.     Electronically Signed   By: Oneil Kiss M.D.   On: 07/14/2021 17:12    Latest Reference Range & Units 11/01/23 12:15 11/01/23 12:18 02/12/24 09:38 06/12/24 15:55 06/12/24  15:56 10/11/24 10:56  TSH 0.350 - 4.500 uIU/mL 9.663 (H)  5.834 (H) 2.881  8.480 (H)  T4,Free(Direct) 0.80 - 2.00 ng/dL  9.08 9.14  9.16 8.76  (H): Data is abnormally high   Assessment & Plan:   1. 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  -She had RAI ablation in October 2022.    Her previsit TFTs are consistent with appropriate hormone replacement (TSH slightly elevated but she admits to missing a dose here and there).  She is advised to continue Levothyroxine  88 mcg po daily before breakfast.  She tried higher dose in the past and did not tolerate well due to palpitations.  Will recheck TFTs prior to next visit and adjust dose accordingly.  If labs stable at next visit, may push out follow up appts.   - The correct intake of thyroid  hormone (Levothyroxine , Synthroid ), is on empty stomach first thing in the morning, with water, separated by at least 30 minutes from breakfast and other medications,  and separated by more than 4 hours from calcium, iron, multivitamins, acid reflux medications (PPIs).  - This medication is a life-long medication and will be needed to correct thyroid  hormone imbalances for the rest of your life.  The dose may change from time to time, based on thyroid  blood work.  - It is extremely important to be consistent taking this medication, near the same time each morning.  -AVOID TAKING PRODUCTS CONTAINING BIOTIN (commonly found in Hair, Skin, Nails vitamins) AS IT INTERFERES WITH THE VALIDITY OF THYROID  FUNCTION BLOOD TESTS.     -Patient is advised to maintain close follow up with Zhou-Talbert, Serena S, MD for primary care  needs.    I spent  17  minutes in the care of the patient today including review of labs from Thyroid  Function, CMP, and other relevant labs ; imaging/biopsy records (current and previous including abstractions from other facilities); face-to-face time discussing  her lab results and symptoms, medications doses, her options of short and long term treatment based on the latest standards of care / guidelines;   and documenting the encounter.  Kevin Hakim  participated in the discussions, expressed understanding, and voiced agreement with the above plans.  All questions were answered to her satisfaction. she is encouraged to contact clinic should she have any questions or concerns prior to her return visit.   Follow up plan: Return in about 4 months (around 02/14/2025) for Thyroid  follow up, Previsit labs.   Thank you for involving me in the care of this pleasant patient, and I will continue to update you with her progress.   Benton Rio, [facility] [address] [place], [place] [address] Phone: [phone] Fax: [phone]  10/17/2024, 8:41 AM "

## 2025-02-14 ENCOUNTER — Ambulatory Visit: Admitting: Nurse Practitioner
